# Patient Record
Sex: Male | Born: 1955 | Race: White | Hispanic: No | Marital: Married | Smoking: Never smoker
Health system: Southern US, Community
[De-identification: ages and names within clinical notes are randomized; demographics above are authoritative.]

## PROBLEM LIST (undated history)

## (undated) DIAGNOSIS — M2550 Pain in unspecified joint: Secondary | ICD-10-CM

## (undated) DIAGNOSIS — I10 Essential (primary) hypertension: Secondary | ICD-10-CM

## (undated) DIAGNOSIS — R7301 Impaired fasting glucose: Secondary | ICD-10-CM

## (undated) DIAGNOSIS — C61 Malignant neoplasm of prostate: Secondary | ICD-10-CM

## (undated) DIAGNOSIS — I2510 Atherosclerotic heart disease of native coronary artery without angina pectoris: Secondary | ICD-10-CM

## (undated) DIAGNOSIS — H269 Unspecified cataract: Secondary | ICD-10-CM

## (undated) DIAGNOSIS — E785 Hyperlipidemia, unspecified: Secondary | ICD-10-CM

## (undated) DIAGNOSIS — E559 Vitamin D deficiency, unspecified: Secondary | ICD-10-CM

## (undated) DIAGNOSIS — C801 Malignant (primary) neoplasm, unspecified: Secondary | ICD-10-CM

## (undated) DIAGNOSIS — E669 Obesity, unspecified: Secondary | ICD-10-CM

## (undated) DIAGNOSIS — R7989 Other specified abnormal findings of blood chemistry: Secondary | ICD-10-CM

## (undated) DIAGNOSIS — M5126 Other intervertebral disc displacement, lumbar region: Secondary | ICD-10-CM

## (undated) DIAGNOSIS — M1990 Unspecified osteoarthritis, unspecified site: Secondary | ICD-10-CM

## (undated) HISTORY — DX: Impaired fasting glucose: R73.01

## (undated) HISTORY — DX: Other specified abnormal findings of blood chemistry: R79.89

## (undated) HISTORY — DX: Malignant neoplasm of prostate: C61

## (undated) HISTORY — DX: Vitamin D deficiency, unspecified: E55.9

## (undated) HISTORY — DX: Pain in unspecified joint: M25.50

## (undated) HISTORY — PX: KNEE ARTHROPLASTY: SHX992

## (undated) HISTORY — DX: Hyperlipidemia, unspecified: E78.5

## (undated) HISTORY — DX: Other intervertebral disc displacement, lumbar region: M51.26

## (undated) HISTORY — DX: Unspecified cataract: H26.9

## (undated) HISTORY — PX: KNEE ARTHROSCOPY: SUR90

## (undated) HISTORY — DX: Obesity, unspecified: E66.9

## (undated) HISTORY — DX: Essential (primary) hypertension: I10

## (undated) HISTORY — PX: APPENDECTOMY: SHX54

## (undated) HISTORY — PX: PROSTATE BIOPSY: SHX241

## (undated) HISTORY — PX: EYE SURGERY: SHX253

---

## 2003-07-18 ENCOUNTER — Emergency Department (HOSPITAL_COMMUNITY): Admission: AD | Admit: 2003-07-18 | Discharge: 2003-07-18 | Payer: Self-pay | Admitting: Family Medicine

## 2004-07-07 ENCOUNTER — Ambulatory Visit: Payer: Self-pay | Admitting: Internal Medicine

## 2004-07-13 ENCOUNTER — Ambulatory Visit (HOSPITAL_COMMUNITY): Admission: RE | Admit: 2004-07-13 | Discharge: 2004-07-13 | Payer: Self-pay | Admitting: Orthopaedic Surgery

## 2004-07-14 ENCOUNTER — Ambulatory Visit: Payer: Self-pay | Admitting: Internal Medicine

## 2004-12-08 ENCOUNTER — Ambulatory Visit: Payer: Self-pay | Admitting: Internal Medicine

## 2004-12-13 ENCOUNTER — Ambulatory Visit: Payer: Self-pay | Admitting: Internal Medicine

## 2005-02-28 ENCOUNTER — Ambulatory Visit: Payer: Self-pay | Admitting: Internal Medicine

## 2005-03-07 ENCOUNTER — Ambulatory Visit: Payer: Self-pay | Admitting: Internal Medicine

## 2005-04-07 ENCOUNTER — Ambulatory Visit: Payer: Self-pay | Admitting: Internal Medicine

## 2005-10-15 ENCOUNTER — Emergency Department (HOSPITAL_COMMUNITY): Admission: EM | Admit: 2005-10-15 | Discharge: 2005-10-15 | Payer: Self-pay

## 2006-12-11 ENCOUNTER — Ambulatory Visit: Payer: Self-pay | Admitting: Internal Medicine

## 2006-12-11 LAB — CONVERTED CEMR LAB
ALT: 24 U/L
AST: 27 U/L
Albumin: 4.3 g/dL
Alkaline Phosphatase: 83 U/L
BUN: 14 mg/dL
Basophils Absolute: 0 K/uL
Basophils Relative: 0.3 %
Bilirubin, Direct: 0.1 mg/dL
CO2: 29 meq/L
Calcium: 9.4 mg/dL
Chloride: 106 meq/L
Cholesterol: 149 mg/dL
Creatinine, Ser: 0.9 mg/dL
Eosinophils Absolute: 0.3 K/uL
Eosinophils Relative: 4.9 %
GFR calc Af Amer: 115 mL/min
GFR calc non Af Amer: 95 mL/min
Glucose, Bld: 99 mg/dL
HCT: 44.6 %
HDL: 35.4 mg/dL — ABNORMAL LOW
Hemoglobin: 15.2 g/dL
LDL Cholesterol: 100 mg/dL — ABNORMAL HIGH
Lymphocytes Relative: 34 %
MCHC: 34 g/dL
MCV: 84.5 fL
Monocytes Absolute: 0.5 K/uL
Monocytes Relative: 8.5 %
Neutro Abs: 3.3 K/uL
Neutrophils Relative %: 52.3 %
PSA: 1.92 ng/mL
Platelets: 261 K/uL
Potassium: 4.2 meq/L
RBC: 5.28 M/uL
RDW: 12.4 %
Sodium: 141 meq/L
TSH: 1.47 u[IU]/mL
Total Bilirubin: 1 mg/dL
Total CHOL/HDL Ratio: 4.2
Total Protein: 6.9 g/dL
Triglycerides: 67 mg/dL
VLDL: 13 mg/dL
WBC: 6.2 10*3/microliter

## 2006-12-14 DIAGNOSIS — I10 Essential (primary) hypertension: Secondary | ICD-10-CM | POA: Insufficient documentation

## 2006-12-14 DIAGNOSIS — E785 Hyperlipidemia, unspecified: Secondary | ICD-10-CM | POA: Insufficient documentation

## 2006-12-18 ENCOUNTER — Ambulatory Visit: Payer: Self-pay | Admitting: Internal Medicine

## 2007-05-28 ENCOUNTER — Telehealth: Payer: Self-pay | Admitting: Internal Medicine

## 2007-05-30 ENCOUNTER — Telehealth (INDEPENDENT_AMBULATORY_CARE_PROVIDER_SITE_OTHER): Payer: Self-pay | Admitting: *Deleted

## 2008-01-02 ENCOUNTER — Telehealth (INDEPENDENT_AMBULATORY_CARE_PROVIDER_SITE_OTHER): Payer: Self-pay | Admitting: *Deleted

## 2010-06-27 ENCOUNTER — Telehealth: Payer: Self-pay | Admitting: Internal Medicine

## 2010-07-06 ENCOUNTER — Ambulatory Visit: Payer: Self-pay | Admitting: Psychology

## 2010-07-14 ENCOUNTER — Ambulatory Visit: Payer: Self-pay | Admitting: Psychology

## 2010-07-19 ENCOUNTER — Ambulatory Visit: Payer: Self-pay | Admitting: Psychology

## 2010-08-15 ENCOUNTER — Ambulatory Visit
Admission: RE | Admit: 2010-08-15 | Discharge: 2010-08-15 | Payer: Self-pay | Source: Home / Self Care | Attending: Psychology | Admitting: Psychology

## 2010-08-30 NOTE — Progress Notes (Signed)
  Phone Note Call from Patient   Caller: Patient Call For: Dr. Lovell Sheehan Summary of Call: Pt called for [facility].....Marland KitchenMarland KitchenLeft message to call back for Triage. Initial call taken by: Lynann Beaver CMA AAMA,  June 27, 2010 11:44 AM  Follow-up for Phone Call        Pt wants to see a marriage counselor and Pyschology or Pyschiatry.  Having marital problems. [phone] Follow-up by: Lynann Beaver CMA AAMA,  June 27, 2010 12:33 PM

## 2010-10-03 ENCOUNTER — Other Ambulatory Visit: Payer: Self-pay | Admitting: Internal Medicine

## 2010-10-10 ENCOUNTER — Other Ambulatory Visit: Payer: Self-pay

## 2010-10-17 ENCOUNTER — Encounter: Payer: Self-pay | Admitting: Internal Medicine

## 2010-11-02 ENCOUNTER — Encounter: Payer: Self-pay | Admitting: Internal Medicine

## 2010-11-08 ENCOUNTER — Other Ambulatory Visit (INDEPENDENT_AMBULATORY_CARE_PROVIDER_SITE_OTHER): Payer: PRIVATE HEALTH INSURANCE

## 2010-11-08 DIAGNOSIS — Z0000 Encounter for general adult medical examination without abnormal findings: Secondary | ICD-10-CM

## 2010-11-08 LAB — CBC WITH DIFFERENTIAL/PLATELET
Basophils Absolute: 0 10*3/uL (ref 0.0–0.1)
Basophils Relative: 0.4 % (ref 0.0–3.0)
Eosinophils Absolute: 0.3 10*3/uL (ref 0.0–0.7)
Eosinophils Relative: 5 % (ref 0.0–5.0)
HCT: 43.8 % (ref 39.0–52.0)
Lymphocytes Relative: 34.7 % (ref 12.0–46.0)
Lymphs Abs: 2 10*3/uL (ref 0.7–4.0)
MCHC: 34.9 g/dL (ref 30.0–36.0)
MCV: 85.9 fl (ref 78.0–100.0)
Monocytes Relative: 8.5 % (ref 3.0–12.0)
Neutro Abs: 3 10*3/uL (ref 1.4–7.7)
Neutrophils Relative %: 51.4 % (ref 43.0–77.0)
Platelets: 233 10*3/uL (ref 150.0–400.0)
RBC: 5.1 Mil/uL (ref 4.22–5.81)
RDW: 13.5 % (ref 11.5–14.6)

## 2010-11-08 LAB — BASIC METABOLIC PANEL
BUN: 18 mg/dL (ref 6–23)
CO2: 26 mEq/L (ref 19–32)
Calcium: 9.6 mg/dL (ref 8.4–10.5)
Chloride: 109 mEq/L (ref 96–112)
Creatinine, Ser: 0.9 mg/dL (ref 0.4–1.5)
GFR: 97.59 mL/min (ref >60.00)
Glucose, Bld: 101 mg/dL — ABNORMAL HIGH (ref 70–99)
Potassium: 4.6 mEq/L (ref 3.5–5.1)
Sodium: 143 mEq/L (ref 135–145)

## 2010-11-08 LAB — POCT URINALYSIS DIPSTICK
Protein, UA: NEGATIVE
Spec Grav, UA: 1.025
Urobilinogen, UA: 0.2
pH, UA: 5

## 2010-11-08 LAB — LIPID PANEL
Cholesterol: 142 mg/dL (ref 0–200)
HDL: 50.1 mg/dL (ref >39.00)
LDL Cholesterol: 78 mg/dL (ref 0–99)
Triglycerides: 70 mg/dL (ref 0.0–149.0)
VLDL: 14 mg/dL (ref 0.0–40.0)

## 2010-11-08 LAB — PSA: PSA: 2.96 ng/mL (ref 0.10–4.00)

## 2010-11-08 LAB — HEPATIC FUNCTION PANEL
ALT: 28 U/L (ref 0–53)
AST: 22 U/L (ref 0–37)
Albumin: 4.2 g/dL (ref 3.5–5.2)
Alkaline Phosphatase: 84 U/L (ref 39–117)
Bilirubin, Direct: 0.1 mg/dL (ref 0.0–0.3)
Total Bilirubin: 0.8 mg/dL (ref 0.3–1.2)
Total Protein: 6.6 g/dL (ref 6.0–8.3)

## 2010-11-08 LAB — TSH: TSH: 1.05 u[IU]/mL (ref 0.35–5.50)

## 2010-11-14 ENCOUNTER — Encounter: Payer: Self-pay | Admitting: Internal Medicine

## 2010-11-14 ENCOUNTER — Ambulatory Visit (INDEPENDENT_AMBULATORY_CARE_PROVIDER_SITE_OTHER): Payer: PRIVATE HEALTH INSURANCE | Admitting: Internal Medicine

## 2010-11-14 VITALS — BP 124/80 | HR 76 | Temp 98.3°F | Resp 14 | Ht 74.0 in | Wt 245.0 lb

## 2010-11-14 DIAGNOSIS — E785 Hyperlipidemia, unspecified: Secondary | ICD-10-CM

## 2010-11-14 DIAGNOSIS — Z0000 Encounter for general adult medical examination without abnormal findings: Secondary | ICD-10-CM

## 2010-11-14 DIAGNOSIS — E291 Testicular hypofunction: Secondary | ICD-10-CM

## 2010-11-14 DIAGNOSIS — I10 Essential (primary) hypertension: Secondary | ICD-10-CM

## 2010-11-14 LAB — TESTOSTERONE: Testosterone: 318.3 ng/dL — ABNORMAL LOW (ref 350.00–890.00)

## 2010-11-14 NOTE — Assessment & Plan Note (Signed)
Blood pressure stable on current medications.

## 2010-11-14 NOTE — Progress Notes (Signed)
  Subjective:    Patient ID: Austin Moses, male    DOB: [birth year], 55 y.o.   MRN: [id number]  HPI As a 55 year old white male presents for complete physical examination.  His risk factors include a family history of heart disease. His blood pressure is well controlled his cholesterol is controlled on Lipitor 40 he is being compliant with his medications he recently has lost weight and reaffirmed and exercise program which resulted in Laboratory values today.  Has been 3 years since his last physical his PSA is elevated there is no family history of prostate cancer the PSA may be elevated consistent with BPH   Review of Systems  Constitutional: Negative for fever and fatigue.  HENT: Negative for hearing loss, congestion, neck pain and postnasal drip.   Eyes: Negative for discharge, redness and visual disturbance.  Respiratory: Negative for cough, shortness of breath and wheezing.   Cardiovascular: Negative for leg swelling.  Gastrointestinal: Negative for abdominal pain, constipation and abdominal distention.  Genitourinary: Negative for urgency and frequency.  Musculoskeletal: Negative for joint swelling and arthralgias.  Skin: Negative for color change and rash.  Neurological: Negative for weakness and light-headedness.  Hematological: Negative for adenopathy.  Psychiatric/Behavioral: Negative for behavioral problems.       Past Medical History  Diagnosis Date  . Hyperlipidemia   . Hypertension    Past Surgical History  Procedure Date  . Knee arthroscopy     dr supple    reports that he has never smoked. He does not have any smokeless tobacco history on file. He reports that he does not drink alcohol or use illicit drugs. family history includes Dementia in his mother; Heart disease in his father; Hyperlipidemia in his mother; and Hypertension in his mother. No Known Allergies  Objective:   Physical Exam  Constitutional: He is oriented to person, place, and time. He  appears well-developed and well-nourished.  HENT:  Head: Normocephalic and atraumatic.  Eyes: Conjunctivae are normal. Pupils are equal, round, and reactive to light.  Neck: Normal range of motion. Neck supple.  Cardiovascular: Normal rate and regular rhythm.   Pulmonary/Chest: Effort normal and breath sounds normal.  Abdominal: Soft. Bowel sounds are normal.  Genitourinary: Rectum normal and prostate normal.       Enlargement of the prostate with normal architecture  Musculoskeletal: Normal range of motion.  Neurological: He is alert and oriented to person, place, and time.  Skin: Skin is warm and dry.  Psychiatric: Judgment and thought content normal.          Assessment & Plan:   Patient presents for yearly preventative medicine examination.   all immunizations and health maintenance protocols were reviewed with the patient and they are up to date with these protocols.   screening laboratory values were reviewed with the patient including screening of hyperlipidemia PSA renal function and hepatic function.   There medications past medical history social history problem list and allergies were reviewed in detail.   Goals were established with regard to weight loss exercise diet in compliance with medications The patient has noticed some decreased sexual functioning and fatigue a testosterone level will be obtained today and appropriate replacement to the level be below 350 this was explained to the patient about testosterone supplementation the risks and benefits and he understands. Recent cholesterol is currently well controlled on Lipitor 40 we have recommended that he continue this medication we'll give him a co-pay card.

## 2010-11-14 NOTE — Patient Instructions (Signed)
We'll check a testosterone level today and call you in about a week if your testosterone level is low I would recommend that you use a topical testosterone replacement there is one that you apply underneath your arm that we have had a great deal of success with... I recommend that you obtain saw palmetto for the mild prostate enlargement

## 2010-11-14 NOTE — Assessment & Plan Note (Signed)
Patient is experiencing some sexual dysfunction and muscle loss and increased fatigue we'll check a testosterone level level today and replace as necessary

## 2010-11-14 NOTE — Assessment & Plan Note (Signed)
Currently at goal on Lipitor 40 mg daily with additional supplementation

## 2010-11-18 ENCOUNTER — Other Ambulatory Visit: Payer: Self-pay | Admitting: *Deleted

## 2010-11-18 DIAGNOSIS — E291 Testicular hypofunction: Secondary | ICD-10-CM

## 2010-11-18 MED ORDER — TESTOSTERONE 30 MG/ACT TD SOLN: 30.0000 mg | Freq: Every day | TRANSDERMAL | Status: DC

## 2010-12-05 ENCOUNTER — Ambulatory Visit (AMBULATORY_SURGERY_CENTER): Payer: PRIVATE HEALTH INSURANCE | Admitting: *Deleted

## 2010-12-05 VITALS — Ht 74.0 in | Wt 247.4 lb

## 2010-12-05 DIAGNOSIS — Z1211 Encounter for screening for malignant neoplasm of colon: Secondary | ICD-10-CM

## 2010-12-05 MED ORDER — PEG-KCL-NACL-NASULF-NA ASC-C 100 G PO SOLR: ORAL | Status: DC

## 2010-12-07 ENCOUNTER — Encounter: Payer: Self-pay | Admitting: Internal Medicine

## 2010-12-09 ENCOUNTER — Other Ambulatory Visit: Payer: Self-pay | Admitting: Internal Medicine

## 2010-12-14 ENCOUNTER — Encounter: Payer: Self-pay | Admitting: Internal Medicine

## 2010-12-14 ENCOUNTER — Ambulatory Visit (AMBULATORY_SURGERY_CENTER): Payer: PRIVATE HEALTH INSURANCE | Admitting: Internal Medicine

## 2010-12-14 VITALS — BP 118/71 | HR 62 | Temp 96.9°F | Resp 22 | Ht 74.0 in | Wt 240.0 lb

## 2010-12-14 DIAGNOSIS — Z1211 Encounter for screening for malignant neoplasm of colon: Secondary | ICD-10-CM

## 2010-12-14 HISTORY — PX: COLONOSCOPY: SHX174

## 2010-12-14 MED ORDER — SODIUM CHLORIDE 0.9 % IV SOLN: 500.0000 mL | INTRAVENOUS | Status: DC

## 2010-12-14 NOTE — Progress Notes (Signed)
Pt talking off and on while the scope was coming out of the colon.  I asked if he was comfortable, he replied, "yes, I'm fine."  Pt rested comfortably all the way out.  No complaints.MAW

## 2010-12-14 NOTE — Patient Instructions (Addendum)
DISCHARGE INSTRUCTIONS BLUE & GREEN SHEETS INCLUDING SAFETY PRECAUTIONS DUE TO SEDATING MEDICATIONS GIVEN TODAY & RECOMMENDED DIET FOR TODAY DISCUSSED & GIVEN TO PT.& CAREPARTNER RECALL COLONOSCOPY DUE IN 10 YEARS.

## 2010-12-15 ENCOUNTER — Telehealth: Payer: Self-pay | Admitting: *Deleted

## 2010-12-15 NOTE — Telephone Encounter (Signed)
Follow up Call- Patient questions:  Do you have a fever, pain , or abdominal swelling? no Pain Score  0 *  Have you tolerated food without any problems? no  Have you been able to return to your normal activities? yes  Do you have any questions about your discharge instructions: Diet   no Medications  no Follow up visit  no  Do you have questions or concerns about your Care? no  Actions: * If pain score is 4 or above: No action needed, pain <4. Discard scores above unable to close encounter with out scores. Message left for patient to call with any questions or concerns

## 2010-12-15 NOTE — Telephone Encounter (Signed)
No answer, message left for the patient to call with any questions or concerns.

## 2011-01-16 ENCOUNTER — Telehealth: Payer: Self-pay | Admitting: Internal Medicine

## 2011-01-16 MED ORDER — ATORVASTATIN CALCIUM 40 MG PO TABS: 40.0000 mg | ORAL_TABLET | Freq: Every day | ORAL | Status: DC

## 2011-01-16 NOTE — Telephone Encounter (Signed)
sent 

## 2011-01-16 NOTE — Telephone Encounter (Signed)
Refill Lipitor on [facility]----[address].

## 2011-02-05 ENCOUNTER — Other Ambulatory Visit: Payer: Self-pay | Admitting: Internal Medicine

## 2011-04-13 ENCOUNTER — Other Ambulatory Visit: Payer: Self-pay | Admitting: Internal Medicine

## 2011-06-06 ENCOUNTER — Other Ambulatory Visit: Payer: Self-pay | Admitting: Internal Medicine

## 2011-06-20 ENCOUNTER — Other Ambulatory Visit: Payer: Self-pay | Admitting: Internal Medicine

## 2011-08-20 ENCOUNTER — Other Ambulatory Visit: Payer: Self-pay | Admitting: Internal Medicine

## 2011-08-25 ENCOUNTER — Telehealth: Payer: Self-pay | Admitting: *Deleted

## 2011-08-25 NOTE — Telephone Encounter (Signed)
Done

## 2012-01-18 ENCOUNTER — Other Ambulatory Visit: Payer: Self-pay | Admitting: *Deleted

## 2012-01-18 ENCOUNTER — Other Ambulatory Visit: Payer: Self-pay | Admitting: Internal Medicine

## 2012-01-19 ENCOUNTER — Other Ambulatory Visit: Payer: Self-pay | Admitting: Internal Medicine

## 2012-06-12 ENCOUNTER — Other Ambulatory Visit: Payer: PRIVATE HEALTH INSURANCE

## 2012-06-19 ENCOUNTER — Encounter: Payer: PRIVATE HEALTH INSURANCE | Admitting: Internal Medicine

## 2012-08-21 ENCOUNTER — Other Ambulatory Visit: Payer: PRIVATE HEALTH INSURANCE

## 2012-08-28 ENCOUNTER — Encounter: Payer: PRIVATE HEALTH INSURANCE | Admitting: Internal Medicine

## 2012-09-05 ENCOUNTER — Other Ambulatory Visit: Payer: Self-pay | Admitting: Internal Medicine

## 2012-09-05 ENCOUNTER — Other Ambulatory Visit: Payer: Self-pay | Admitting: *Deleted

## 2012-09-16 ENCOUNTER — Other Ambulatory Visit: Payer: Self-pay | Admitting: Internal Medicine

## 2012-09-16 ENCOUNTER — Other Ambulatory Visit (INDEPENDENT_AMBULATORY_CARE_PROVIDER_SITE_OTHER): Payer: PRIVATE HEALTH INSURANCE

## 2012-09-16 DIAGNOSIS — Z0000 Encounter for general adult medical examination without abnormal findings: Secondary | ICD-10-CM

## 2012-09-16 LAB — BASIC METABOLIC PANEL
BUN: 21 mg/dL (ref 6–23)
CO2: 26 mEq/L (ref 19–32)
Calcium: 9.5 mg/dL (ref 8.4–10.5)
Chloride: 104 mEq/L (ref 96–112)
Creatinine, Ser: 1 mg/dL (ref 0.4–1.5)
GFR: 84.47 mL/min (ref >60.00)
Glucose, Bld: 101 mg/dL — ABNORMAL HIGH (ref 70–99)
Potassium: 4.5 mEq/L (ref 3.5–5.1)
Sodium: 137 mEq/L (ref 135–145)

## 2012-09-16 LAB — CBC WITH DIFFERENTIAL/PLATELET
Basophils Absolute: 0 10*3/uL (ref 0.0–0.1)
Basophils Relative: 0.5 % (ref 0.0–3.0)
Eosinophils Absolute: 0.4 10*3/uL (ref 0.0–0.7)
Eosinophils Relative: 5.2 % — ABNORMAL HIGH (ref 0.0–5.0)
HCT: 46.5 % (ref 39.0–52.0)
Hemoglobin: 15.7 g/dL (ref 13.0–17.0)
Lymphocytes Relative: 33.5 % (ref 12.0–46.0)
Lymphs Abs: 2.3 10*3/uL (ref 0.7–4.0)
MCHC: 33.7 g/dL (ref 30.0–36.0)
MCV: 84.7 fl (ref 78.0–100.0)
Monocytes Absolute: 0.6 10*3/uL (ref 0.1–1.0)
Monocytes Relative: 8.9 % (ref 3.0–12.0)
Neutro Abs: 3.5 10*3/uL (ref 1.4–7.7)
Neutrophils Relative %: 51.9 % (ref 43.0–77.0)
Platelets: 227 10*3/uL (ref 150.0–400.0)
RBC: 5.49 Mil/uL (ref 4.22–5.81)
RDW: 13.1 % (ref 11.5–14.6)

## 2012-09-16 LAB — LIPID PANEL
Cholesterol: 186 mg/dL (ref 0–200)
HDL: 50.5 mg/dL (ref >39.00)
LDL Cholesterol: 103 mg/dL — ABNORMAL HIGH (ref 0–99)
Triglycerides: 165 mg/dL — ABNORMAL HIGH (ref 0.0–149.0)
VLDL: 33 mg/dL (ref 0.0–40.0)

## 2012-09-16 LAB — POCT URINALYSIS DIPSTICK
Bilirubin, UA: NEGATIVE
Blood, UA: NEGATIVE
Glucose, UA: NEGATIVE
Ketones, UA: NEGATIVE
Spec Grav, UA: 1.01
Urobilinogen, UA: 0.2

## 2012-09-16 LAB — HEPATIC FUNCTION PANEL
ALT: 39 U/L (ref 0–53)
AST: 25 U/L (ref 0–37)
Albumin: 4.4 g/dL (ref 3.5–5.2)
Alkaline Phosphatase: 68 U/L (ref 39–117)
Bilirubin, Direct: 0.2 mg/dL (ref 0.0–0.3)
Total Bilirubin: 1.1 mg/dL (ref 0.3–1.2)
Total Protein: 6.9 g/dL (ref 6.0–8.3)

## 2012-09-16 LAB — PSA: PSA: 2.74 ng/mL (ref 0.10–4.00)

## 2012-09-16 LAB — TSH: TSH: 1.78 u[IU]/mL (ref 0.35–5.50)

## 2012-09-23 ENCOUNTER — Encounter: Payer: Self-pay | Admitting: Internal Medicine

## 2012-09-23 ENCOUNTER — Ambulatory Visit (INDEPENDENT_AMBULATORY_CARE_PROVIDER_SITE_OTHER): Payer: PRIVATE HEALTH INSURANCE | Admitting: Internal Medicine

## 2012-09-23 VITALS — BP 140/80 | HR 64 | Temp 97.8°F | Resp 16 | Ht 74.0 in | Wt 250.0 lb

## 2012-09-23 DIAGNOSIS — E291 Testicular hypofunction: Secondary | ICD-10-CM

## 2012-09-23 DIAGNOSIS — I10 Essential (primary) hypertension: Secondary | ICD-10-CM

## 2012-09-23 DIAGNOSIS — Z0000 Encounter for general adult medical examination without abnormal findings: Secondary | ICD-10-CM

## 2012-09-23 NOTE — Progress Notes (Signed)
Subjective:    Patient ID: Austin Moses, male    DOB: [birth year], 57 y.o.   MRN: [id number]  HPI  Presents for yearly physical examination.  The patient has been on testosterone topical  As we stop the topical and has been taking a supplement from a health food store that is meant to stimulate testicular function  Review of Systems  Constitutional: Negative for fever and fatigue.  HENT: Negative for hearing loss, congestion, neck pain and postnasal drip.   Eyes: Negative for discharge, redness and visual disturbance.  Respiratory: Negative for cough, shortness of breath and wheezing.   Cardiovascular: Negative for leg swelling.  Gastrointestinal: Negative for abdominal pain, constipation and abdominal distention.  Genitourinary: Negative for urgency and frequency.  Musculoskeletal: Negative for joint swelling and arthralgias.  Skin: Negative for color change and rash.  Neurological: Negative for weakness and light-headedness.  Hematological: Negative for adenopathy.  Psychiatric/Behavioral: Negative for behavioral problems.   Past Medical History  Diagnosis Date  . Hyperlipidemia   . Hypertension     History   Social History  . Marital Status: Single    Spouse Name: N/A    Number of Children: N/A  . Years of Education: N/A   Occupational History  . Not on file.   Social History Main Topics  . Smoking status: Never Smoker   . Smokeless tobacco: Not on file  . Alcohol Use: 3.0 oz/week    5 Cans of beer per week  . Drug Use: No  . Sexually Active: Yes   Other Topics Concern  . Not on file   Social History Narrative  . No narrative on file    Past Surgical History  Procedure Laterality Date  . Knee arthroscopy      dr supple  . Appendectomy    . Colonoscopy  12/14/2010    Normal    Family History  Problem Relation Age of Onset  . Hyperlipidemia Mother   . Hypertension Mother   . Dementia Mother   . Heart disease Father   . Colon polyps Father      Allergies  Allergen Reactions  . Sulfa Antibiotics Rash    Current Outpatient Prescriptions on File Prior to Visit  Medication Sig Dispense Refill  . amLODipine-benazepril (LOTREL) 5-10 MG per capsule take 1 capsule by mouth once daily  30 capsule  PRN  . aspirin 81 MG EC tablet Take 81 mg by mouth daily.        Marland Kitchen atorvastatin (LIPITOR) 40 MG tablet take 1 tablet by mouth once daily  30 tablet  11  . fish oil-omega-3 fatty acids 1000 MG capsule Take 2 g by mouth daily.        Randa Ngo 30 MG/ACT SOLN apply 1 PUMP once daily  90 mL  5   Current Facility-Administered Medications on File Prior to Visit  Medication Dose Route Frequency Provider Last Rate Last Dose  . 0.9 %  sodium chloride infusion  500 mL Intravenous Continuous Iva Boop, MD        BP 140/80  Pulse 64  Temp(Src) 97.8 F (36.6 C)  Resp 16  Ht 6\' 2"  (1.88 m)  Wt 250 lb (113.399 kg)  BMI 32.08 kg/m2       Objective:   Physical Exam  Nursing note and vitals reviewed. Constitutional: He is oriented to person, place, and time. He appears well-developed and well-nourished.  HENT:  Head: Normocephalic and atraumatic.  Eyes: Conjunctivae are normal. Pupils  are equal, round, and reactive to light.  Neck: Normal range of motion. Neck supple.  Cardiovascular: Normal rate and regular rhythm.   Pulmonary/Chest: Effort normal and breath sounds normal.  Abdominal: Soft. Bowel sounds are normal.  Genitourinary: Rectum normal and prostate normal.  2 enlarged and smooth  Musculoskeletal: Normal range of motion.  Neurological: He is alert and oriented to person, place, and time.  Skin: Skin is warm and dry.  Psychiatric: He has a normal mood and affect.          Assessment & Plan:   Patient presents for yearly preventative medicine examination.   all immunizations and health maintenance protocols were reviewed with the patient and they are up to date with these protocols.   screening laboratory values  were reviewed with the patient including screening of hyperlipidemia PSA renal function and hepatic function.   There medications past medical history social history problem list and allergies were reviewed in detail.   Goals were established with regard to weight loss exercise diet in compliance with medications

## 2012-10-11 ENCOUNTER — Encounter: Payer: Self-pay | Admitting: Internal Medicine

## 2012-10-30 ENCOUNTER — Telehealth: Payer: Self-pay | Admitting: Internal Medicine

## 2012-10-30 MED ORDER — TESTOSTERONE CYPIONATE 200 MG/ML IM SOLN: 50.0000 mg | INTRAMUSCULAR | Status: DC

## 2012-10-30 NOTE — Telephone Encounter (Signed)
Spoke w/ Mosetta Putt re testosterone options - please see Pt Email in chart. He does want to give the injections a try - Dr. Lovell Sheehan' pt email message suggested once weekly injections. Pt states he can give himself the injection. Please advise re switching from Axiron to injections.  Pt uses [facility] [address].

## 2013-01-13 ENCOUNTER — Other Ambulatory Visit: Payer: Self-pay | Admitting: Internal Medicine

## 2013-02-07 ENCOUNTER — Other Ambulatory Visit: Payer: Self-pay | Admitting: Internal Medicine

## 2013-04-03 ENCOUNTER — Other Ambulatory Visit: Payer: Self-pay | Admitting: *Deleted

## 2013-04-03 MED ORDER — TESTOSTERONE CYPIONATE 200 MG/ML IM SOLN: 50.0000 mg | INTRAMUSCULAR | Status: DC

## 2013-06-05 ENCOUNTER — Other Ambulatory Visit: Payer: Self-pay

## 2013-10-06 ENCOUNTER — Other Ambulatory Visit: Payer: Self-pay | Admitting: Internal Medicine

## 2014-01-15 ENCOUNTER — Telehealth: Payer: Self-pay | Admitting: Internal Medicine

## 2014-01-15 NOTE — Telephone Encounter (Signed)
°   [place] [place] [facility] [place] is requesting re-fills on the following: amLODipine-benazepril (LOTREL) 5-10 MG per capsule testosterone cypionate (DEPO-TESTOSTERONE) 200 MG/ML injection atorvastatin (LIPITOR) 40 MG tablet

## 2014-01-16 NOTE — Telephone Encounter (Signed)
Pt needs an OV  

## 2014-01-16 NOTE — Telephone Encounter (Signed)
LMOM for pt to call office and schedule OV. °

## 2014-01-16 NOTE — Telephone Encounter (Signed)
Pt called back stating he has moved out of the city and would like to know if he can have his medication re-filled until he is established with another PCP in his area??

## 2014-01-19 MED ORDER — TESTOSTERONE CYPIONATE 200 MG/ML IM SOLN: 50.0000 mg | INTRAMUSCULAR | Status: DC

## 2014-01-19 MED ORDER — ATORVASTATIN CALCIUM 40 MG PO TABS: ORAL_TABLET | ORAL | Status: DC

## 2014-01-19 MED ORDER — AMLODIPINE BESY-BENAZEPRIL HCL 5-10 MG PO CAPS: ORAL_CAPSULE | ORAL | Status: DC

## 2014-01-19 NOTE — Telephone Encounter (Signed)
rx's sent in electronically to [facility]

## 2014-01-22 ENCOUNTER — Other Ambulatory Visit: Payer: Self-pay | Admitting: Internal Medicine

## 2014-04-10 ENCOUNTER — Other Ambulatory Visit: Payer: Self-pay | Admitting: Internal Medicine

## 2015-11-16 DIAGNOSIS — M1712 Unilateral primary osteoarthritis, left knee: Secondary | ICD-10-CM | POA: Diagnosis not present

## 2015-11-16 DIAGNOSIS — M25562 Pain in left knee: Secondary | ICD-10-CM | POA: Diagnosis not present

## 2015-12-06 DIAGNOSIS — Z1389 Encounter for screening for other disorder: Secondary | ICD-10-CM | POA: Diagnosis not present

## 2015-12-06 DIAGNOSIS — I10 Essential (primary) hypertension: Secondary | ICD-10-CM | POA: Diagnosis not present

## 2015-12-06 DIAGNOSIS — E784 Other hyperlipidemia: Secondary | ICD-10-CM | POA: Diagnosis not present

## 2015-12-06 DIAGNOSIS — R972 Elevated prostate specific antigen [PSA]: Secondary | ICD-10-CM | POA: Diagnosis not present

## 2015-12-06 DIAGNOSIS — R635 Abnormal weight gain: Secondary | ICD-10-CM | POA: Diagnosis not present

## 2016-01-03 DIAGNOSIS — R972 Elevated prostate specific antigen [PSA]: Secondary | ICD-10-CM | POA: Diagnosis not present

## 2016-02-11 DIAGNOSIS — R972 Elevated prostate specific antigen [PSA]: Secondary | ICD-10-CM | POA: Diagnosis not present

## 2016-03-22 DIAGNOSIS — R972 Elevated prostate specific antigen [PSA]: Secondary | ICD-10-CM | POA: Diagnosis not present

## 2016-04-05 DIAGNOSIS — C61 Malignant neoplasm of prostate: Secondary | ICD-10-CM | POA: Diagnosis not present

## 2016-04-05 DIAGNOSIS — E291 Testicular hypofunction: Secondary | ICD-10-CM | POA: Diagnosis not present

## 2016-04-05 DIAGNOSIS — N5201 Erectile dysfunction due to arterial insufficiency: Secondary | ICD-10-CM | POA: Diagnosis not present

## 2016-05-16 DIAGNOSIS — C61 Malignant neoplasm of prostate: Secondary | ICD-10-CM | POA: Diagnosis not present

## 2016-05-30 DIAGNOSIS — M25562 Pain in left knee: Secondary | ICD-10-CM | POA: Diagnosis not present

## 2016-05-30 DIAGNOSIS — M1712 Unilateral primary osteoarthritis, left knee: Secondary | ICD-10-CM | POA: Diagnosis not present

## 2016-05-31 HISTORY — PX: OTHER SURGICAL HISTORY: SHX169

## 2016-07-03 DIAGNOSIS — Z0000 Encounter for general adult medical examination without abnormal findings: Secondary | ICD-10-CM | POA: Diagnosis not present

## 2016-07-06 DIAGNOSIS — Z0000 Encounter for general adult medical examination without abnormal findings: Secondary | ICD-10-CM | POA: Diagnosis not present

## 2016-07-10 DIAGNOSIS — Z1389 Encounter for screening for other disorder: Secondary | ICD-10-CM | POA: Diagnosis not present

## 2016-07-10 DIAGNOSIS — E784 Other hyperlipidemia: Secondary | ICD-10-CM | POA: Diagnosis not present

## 2016-07-10 DIAGNOSIS — C61 Malignant neoplasm of prostate: Secondary | ICD-10-CM | POA: Diagnosis not present

## 2016-07-10 DIAGNOSIS — I10 Essential (primary) hypertension: Secondary | ICD-10-CM | POA: Diagnosis not present

## 2016-07-10 DIAGNOSIS — Z0000 Encounter for general adult medical examination without abnormal findings: Secondary | ICD-10-CM | POA: Diagnosis not present

## 2016-07-10 DIAGNOSIS — R7301 Impaired fasting glucose: Secondary | ICD-10-CM | POA: Diagnosis not present

## 2016-07-12 ENCOUNTER — Other Ambulatory Visit: Payer: Self-pay | Admitting: Urology

## 2016-07-12 NOTE — Progress Notes (Signed)
Pt is being scheduled for preop appt; please place surgical orders in epic. Thanks.  

## 2016-07-18 DIAGNOSIS — Z1212 Encounter for screening for malignant neoplasm of rectum: Secondary | ICD-10-CM | POA: Diagnosis not present

## 2016-07-20 NOTE — Patient Instructions (Addendum)
Austin Moses  07/20/2016   Your procedure is scheduled on: Monday 08/07/2016  Report to [facility] Main  Entrance take [place]  elevators to 3rd floor to  [place] at  [phone].  Call this number if you have problems the morning of surgery [phone]   Remember: ONLY 1 PERSON MAY GO WITH YOU TO SHORT STAY TO GET  READY MORNING OF [place].               FOLLOW BOWEL PREP INSTRUCTIONS FROM DR. BORDEN'S OFFICE THE DAY BEFORE SURGERY ALONG WITH A CLEAR DIET ALL DAY TIL MIDNIGHT!              DRINK ONE BOTTLE OF MAGNESIUM CITRATE BY 12 NOON THE DAY BEFORE SURGERY AND FOLLOW A CLEAR LIQUID DIET ALL DAY!              USE ONE CONTAINER OF FLEET'S ENEMA NIGHT BEFORE SURGERY!         CLEAR LIQUID DIET   Foods Allowed                                                                     Foods Excluded  Coffee and tea, regular and decaf                             liquids that you cannot  Plain Jell-O in any flavor                                             see through such as: Fruit ices (not with fruit pulp)                                     milk, soups, orange juice  Iced Popsicles                                    All solid food Carbonated beverages, regular and diet                                    Cranberry, grape and apple juices Sports drinks like Gatorade Lightly seasoned clear broth or consume(fat free) Sugar, honey syrup  Sample Menu Breakfast                                Lunch                                     Supper Cranberry juice                    Beef broth  Chicken broth Jell-O                                     Grape juice                           Apple juice Coffee or tea                        Jell-O                                      Popsicle                                                Coffee or tea                        Coffee or  tea  _____________________________________________________________________    Do not eat food or drink liquids :After Midnight.     Take these medicines the morning of surgery with A SIP OF WATER: none                                  You may not have any metal on your body including hair pins and              piercings  Do not wear jewelry, make-up, lotions, powders or perfumes, deodorant             Do not wear nail polish.  Do not shave  48 hours prior to surgery.              Men may shave face and neck.   Do not bring valuables to the hospital. [place].  Contacts, dentures or bridgework may not be worn into surgery.  Leave suitcase in the car. After surgery it may be brought to your room.                  Please read over the following fact sheets you were given: _____________________________________________________________________             [facility] - Preparing for Surgery Before surgery, you can play an important role.  Because skin is not sterile, your skin needs to be as free of germs as possible.  You can reduce the number of germs on your skin by washing with CHG (chlorahexidine gluconate) soap before surgery.  CHG is an antiseptic cleaner which kills germs and bonds with the skin to continue killing germs even after washing. Please DO NOT use if you have an allergy to CHG or antibacterial soaps.  If your skin becomes reddened/irritated stop using the CHG and inform your nurse when you arrive at Short Stay. Do not shave (including legs and underarms) for at least 48 hours prior to the first CHG shower.  You may shave your face/neck. Please follow these instructions carefully:  1.  Shower with CHG Soap the night before  surgery and the  morning of Surgery.  2.  If you choose to wash your hair, wash your hair first as usual with your  normal  shampoo.  3.  After you shampoo, rinse your hair and body thoroughly to  remove the  shampoo.                           4.  Use CHG as you would any other liquid soap.  You can apply chg directly  to the skin and wash                       Gently with a scrungie or clean washcloth.  5.  Apply the CHG Soap to your body ONLY FROM THE NECK DOWN.   Do not use on face/ open                           Wound or open sores. Avoid contact with eyes, ears mouth and genitals (private parts).                       Wash face,  Genitals (private parts) with your normal soap.             6.  Wash thoroughly, paying special attention to the area where your surgery  will be performed.  7.  Thoroughly rinse your body with warm water from the neck down.  8.  DO NOT shower/wash with your normal soap after using and rinsing off  the CHG Soap.                9.  Pat yourself dry with a clean towel.            10.  Wear clean pajamas.            11.  Place clean sheets on your bed the night of your first shower and do not  sleep with pets. Day of Surgery : Do not apply any lotions/deodorants the morning of surgery.  Please wear clean clothes to the hospital/surgery center.  FAILURE TO FOLLOW THESE INSTRUCTIONS MAY RESULT IN THE CANCELLATION OF YOUR SURGERY PATIENT SIGNATURE_________________________________  NURSE SIGNATURE__________________________________  ________________________________________________________________________  WHAT IS A BLOOD TRANSFUSION? Blood Transfusion Information  A transfusion is the replacement of blood or some of its parts. Blood is made up of multiple cells which provide different functions.  Red blood cells carry oxygen and are used for blood loss replacement.  White blood cells fight against infection.  Platelets control bleeding.  Plasma helps clot blood.  Other blood products are available for specialized needs, such as hemophilia or other clotting disorders. BEFORE THE TRANSFUSION  Who gives blood for transfusions?   Healthy volunteers who  are fully evaluated to make sure their blood is safe. This is blood bank blood. Transfusion therapy is the safest it has ever been in the practice of medicine. Before blood is taken from a donor, a complete history is taken to make sure that person has no history of diseases nor engages in risky social behavior (examples are intravenous drug use or sexual activity with multiple partners). The donor's travel history is screened to minimize risk of transmitting infections, such as malaria. The donated blood is tested for signs of infectious diseases, such as HIV and hepatitis. The blood is then tested to be sure it is compatible with  you in order to minimize the chance of a transfusion reaction. If you or a relative donates blood, this is often done in anticipation of surgery and is not appropriate for emergency situations. It takes many days to process the donated blood. RISKS AND COMPLICATIONS Although transfusion therapy is very safe and saves many lives, the main dangers of transfusion include:   Getting an infectious disease.  Developing a transfusion reaction. This is an allergic reaction to something in the blood you were given. Every precaution is taken to prevent this. The decision to have a blood transfusion has been considered carefully by your caregiver before blood is given. Blood is not given unless the benefits outweigh the risks. AFTER THE TRANSFUSION  Right after receiving a blood transfusion, you will usually feel much better and more energetic. This is especially true if your red blood cells have gotten low (anemic). The transfusion raises the level of the red blood cells which carry oxygen, and this usually causes an energy increase.  The nurse administering the transfusion will monitor you carefully for complications. HOME CARE INSTRUCTIONS  No special instructions are needed after a transfusion. You may find your energy is better. Speak with your caregiver about any limitations on  activity for underlying diseases you may have. SEEK MEDICAL CARE IF:   Your condition is not improving after your transfusion.  You develop redness or irritation at the intravenous (IV) site. SEEK IMMEDIATE MEDICAL CARE IF:  Any of the following symptoms occur over the next 12 hours:  Shaking chills.  You have a temperature by mouth above 102 F (38.9 C), not controlled by medicine.  Chest, back, or muscle pain.  People around you feel you are not acting correctly or are confused.  Shortness of breath or difficulty breathing.  Dizziness and fainting.  You get a rash or develop hives.  You have a decrease in urine output.  Your urine turns a dark color or changes to pink, red, or brown. Any of the following symptoms occur over the next 10 days:  You have a temperature by mouth above 102 F (38.9 C), not controlled by medicine.  Shortness of breath.  Weakness after normal activity.  The white part of the eye turns yellow (jaundice).  You have a decrease in the amount of urine or are urinating less often.  Your urine turns a dark color or changes to pink, red, or brown. Document Released: 07/14/2000 Document Revised: 10/09/2011 Document Reviewed: 03/02/2008 [facility] Patient Information 2014 [place], [place].  _______________________________________________________________________

## 2016-07-25 ENCOUNTER — Encounter (HOSPITAL_COMMUNITY): Payer: Self-pay

## 2016-07-25 ENCOUNTER — Encounter (HOSPITAL_COMMUNITY)
Admission: RE | Admit: 2016-07-25 | Discharge: 2016-07-25 | Disposition: A | Discharge status: Home / Self Care | Payer: BLUE CROSS/BLUE SHIELD | Source: Ambulatory Visit | Attending: Urology | Admitting: Urology

## 2016-07-25 ENCOUNTER — Ambulatory Visit (HOSPITAL_COMMUNITY)
Admission: RE | Admit: 2016-07-25 | Discharge: 2016-07-25 | Disposition: A | Discharge status: Home / Self Care | Payer: BLUE CROSS/BLUE SHIELD | Source: Ambulatory Visit | Attending: Urology | Admitting: Urology

## 2016-07-25 DIAGNOSIS — Z01812 Encounter for preprocedural laboratory examination: Secondary | ICD-10-CM | POA: Insufficient documentation

## 2016-07-25 DIAGNOSIS — C61 Malignant neoplasm of prostate: Secondary | ICD-10-CM | POA: Diagnosis not present

## 2016-07-25 DIAGNOSIS — R9431 Abnormal electrocardiogram [ECG] [EKG]: Secondary | ICD-10-CM | POA: Insufficient documentation

## 2016-07-25 DIAGNOSIS — I10 Essential (primary) hypertension: Secondary | ICD-10-CM | POA: Diagnosis not present

## 2016-07-25 DIAGNOSIS — M6281 Muscle weakness (generalized): Secondary | ICD-10-CM | POA: Diagnosis not present

## 2016-07-25 DIAGNOSIS — Z01818 Encounter for other preprocedural examination: Secondary | ICD-10-CM | POA: Insufficient documentation

## 2016-07-25 DIAGNOSIS — Z01811 Encounter for preprocedural respiratory examination: Secondary | ICD-10-CM

## 2016-07-25 HISTORY — DX: Malignant (primary) neoplasm, unspecified: C80.1

## 2016-07-25 HISTORY — DX: Unspecified osteoarthritis, unspecified site: M19.90

## 2016-07-25 LAB — CBC
HCT: 43 % (ref 39.0–52.0)
Hemoglobin: 14.7 g/dL (ref 13.0–17.0)
MCH: 27.7 pg (ref 26.0–34.0)
MCHC: 34.2 g/dL (ref 30.0–36.0)
MCV: 81 fL (ref 78.0–100.0)
PLATELETS: 267 10*3/uL (ref 150–400)
RBC: 5.31 MIL/uL (ref 4.22–5.81)
RDW: 13 % (ref 11.5–15.5)
WBC: 9.5 10*3/uL (ref 4.0–10.5)

## 2016-07-25 LAB — BASIC METABOLIC PANEL
Anion gap: 6 (ref 5–15)
BUN: 16 mg/dL (ref 6–20)
CALCIUM: 9.4 mg/dL (ref 8.9–10.3)
CO2: 28 mmol/L (ref 22–32)
CREATININE: 1.15 mg/dL (ref 0.61–1.24)
Chloride: 109 mmol/L (ref 101–111)
GFR calc non Af Amer: >60 mL/min (ref >60)
Glucose, Bld: 96 mg/dL (ref 65–99)
Potassium: 4.1 mmol/L (ref 3.5–5.1)
SODIUM: 143 mmol/L (ref 135–145)

## 2016-07-25 NOTE — Progress Notes (Signed)
   07/25/16 1307  OBSTRUCTIVE SLEEP APNEA  Have you ever been diagnosed with sleep apnea through a sleep study? No  Do you snore loudly (loud enough to be heard through closed doors)?  0  Do you often feel tired, fatigued, or sleepy during the daytime (such as falling asleep during driving or talking to someone)? 0  Has anyone observed you stop breathing during your sleep? 0  Do you have, or are you being treated for high blood pressure? 1  BMI more than 35 kg/m2? 1  Age > 50 (1-yes) 1  Neck circumference greater than:Male 16 inches or larger, Male 17inches or larger? 1  Male Gender (Yes=1) 1  Obstructive Sleep Apnea Score 5  Score 5 or greater  Results sent to PCP

## 2016-08-04 NOTE — H&P (Signed)
CC/HPI: CC: Prostate Cancer    Austin Moses is a 61 year old gentleman who was noted to have an elevated PSA of 6.67 prompting a TRUS biopsy of the prostate on 03/22/16. This confirmed Gleason 3+3=6 adenocarcinoma of the prostate in 30% of 1 out of 12 biopsy cores.   Family history: None   Imaging studies: None   PMH: He has a history of hypertension, hypercholesterolemia, and testosterone deficiency.  PSH: Appendectomy.   TNM stage: cT1c Nx Mx  PSA: 6.67  Gleason score: 3+3=6  Biopsy (03/22/16): 1/12 cores positive  Left: L base (30%, 3+3=6)  Right: Benign  Prostate volume: 84 cc  PSAD: 0.08   Nomogram  OC disease: 66%  EPE: 33%  SVI: 1%  LNI: 1%  PFS (5 year, 10 year): 95%,90%   Urinary function: IPSS is 4.  Erectile function: SHIM score is 2. He does have severe erectile dysfunction. He has not previously tried any treatment as this has been a lower priority to him and his wife.     ALLERGIES: Sulfa Drugs    MEDICATIONS: Aspirin 81 MG TABS Oral  Lipitor 40 MG Oral Tablet Oral  Lotrel 5-10 MG Oral Capsule Oral     GU PSH: Prostate Needle Biopsy - 03/22/2016      PSH Notes: Knee Surgery, Cataract Surgery   NON-GU PSH: Surgical Pathology, Gross And Microscopic Examination For Prostate Needle - 03/22/2016    GU PMH: ED, arterial insufficiency, SHIM is 11. - 04/05/2016 Prostate Cancer, T1c Nx Mx Gleason 6 very low risk prostate cancer. 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   Social Drinker.  Drinks 3 caffeinated drinks per day.     Notes: Never a smoker, Occupation, Number of children, Alcohol Use, Marital History - Currently Married, Caffeine Use, Tobacco Use   REVIEW OF SYSTEMS:    GU Review Male:   Patient denies frequent urination, hard to postpone urination, burning/ pain with urination, get up at night to urinate, leakage of urine, stream starts and stops, trouble starting your streams, and have to strain to urinate .  Gastrointestinal (Upper):   Patient denies nausea and vomiting.  Gastrointestinal (Lower):   Patient denies diarrhea and constipation.  Constitutional:   Patient denies fever, night sweats, weight loss, and fatigue.  Skin:   Patient denies skin rash/ lesion and itching.  Eyes:   Patient denies blurred vision and double vision.  Ears/ Nose/ Throat:   Patient denies sore throat and sinus problems.  Hematologic/Lymphatic:   Patient denies swollen glands and easy bruising.  Cardiovascular:   Patient denies leg swelling and chest pains.  Respiratory:   Patient denies cough and shortness of breath.  Endocrine:   Patient denies excessive thirst.  Musculoskeletal:   Patient denies back pain and joint pain.  Neurological:   Patient denies headaches and dizziness.  Psychologic:   Patient denies depression and anxiety.      MULTI-SYSTEM PHYSICAL EXAMINATION:    Constitutional: Well-nourished. No physical deformities. Normally developed. Good grooming.  Neck: Neck symmetrical, not swollen. Normal tracheal  position.  Respiratory: No labored breathing, no use of accessory muscles. Clear bilaterally.  Cardiovascular: Normal temperature, normal extremity pulses, no swelling, no varicosities. Regular rate and rhythm.  Lymphatic: No enlargement of neck, axillae, groin.  Skin: No paleness, no jaundice, no cyanosis. No lesion, no ulcer, no rash.  Neurologic / Psychiatric: Oriented to time, oriented to place, oriented to person. No depression, no anxiety,  no agitation.  Gastrointestinal: No mass, no tenderness, no rigidity, non obese abdomen. He has a small reducible umbilical hernia.  Eyes: Normal conjunctivae. Normal eyelids.  Ears, Nose, Mouth, and Throat: Left ear no scars, no lesions, no masses. Right ear no scars, no lesions, no masses. Nose no scars, no lesions, no masses. Normal hearing. Normal lips.  Musculoskeletal: Normal gait and station of head and neck.          ASSESSMENT:      ICD-10 Details  1 GU:   Prostate Cancer - C61    PLAN:     1. Very low risk prostate cancer: We have had an extensive discussion regarding management options for his low risk prostate cancer.  Ultimately, he has decided to proceed with surgical treatment understanding the potential risks of therapy related to urinary function and erectile function.  He will plan to undergo a bilateral nerve sparing robot-assisted laparoscopic radical prostatectomy.  Informed consent has been obtained.

## 2016-08-06 MED ORDER — DEXTROSE 5 % IV SOLN
3.0000 g | INTRAVENOUS | Status: AC
  Administered 2016-08-07: 3 g via INTRAVENOUS
  Filled 2016-08-06: qty 3

## 2016-08-07 ENCOUNTER — Encounter (HOSPITAL_COMMUNITY)
Admission: RE | Disposition: A | Discharge status: Home / Self Care | Payer: Self-pay | Source: Ambulatory Visit | Attending: Urology

## 2016-08-07 ENCOUNTER — Inpatient Hospital Stay (HOSPITAL_COMMUNITY): Payer: BLUE CROSS/BLUE SHIELD | Admitting: Anesthesiology

## 2016-08-07 ENCOUNTER — Inpatient Hospital Stay (HOSPITAL_COMMUNITY)
Admission: RE | Admit: 2016-08-07 | Discharge: 2016-08-08 | DRG: 708 | Disposition: A | Discharge status: Home / Self Care | Payer: BLUE CROSS/BLUE SHIELD | Source: Ambulatory Visit | Attending: Urology | Admitting: Urology

## 2016-08-07 ENCOUNTER — Encounter (HOSPITAL_COMMUNITY): Payer: Self-pay | Admitting: *Deleted

## 2016-08-07 DIAGNOSIS — E7800 Pure hypercholesterolemia, unspecified: Secondary | ICD-10-CM | POA: Diagnosis present

## 2016-08-07 DIAGNOSIS — Z79899 Other long term (current) drug therapy: Secondary | ICD-10-CM

## 2016-08-07 DIAGNOSIS — I10 Essential (primary) hypertension: Secondary | ICD-10-CM | POA: Diagnosis present

## 2016-08-07 DIAGNOSIS — Z7982 Long term (current) use of aspirin: Secondary | ICD-10-CM

## 2016-08-07 DIAGNOSIS — Z8249 Family history of ischemic heart disease and other diseases of the circulatory system: Secondary | ICD-10-CM | POA: Diagnosis not present

## 2016-08-07 DIAGNOSIS — Z6836 Body mass index (BMI) 36.0-36.9, adult: Secondary | ICD-10-CM | POA: Diagnosis not present

## 2016-08-07 DIAGNOSIS — C61 Malignant neoplasm of prostate: Secondary | ICD-10-CM | POA: Diagnosis not present

## 2016-08-07 DIAGNOSIS — E291 Testicular hypofunction: Secondary | ICD-10-CM | POA: Diagnosis not present

## 2016-08-07 DIAGNOSIS — N529 Male erectile dysfunction, unspecified: Secondary | ICD-10-CM | POA: Diagnosis present

## 2016-08-07 HISTORY — PX: ROBOT ASSISTED LAPAROSCOPIC RADICAL PROSTATECTOMY: SHX5141

## 2016-08-07 LAB — TYPE AND SCREEN
ABO/RH(D): O POS
ANTIBODY SCREEN: NEGATIVE

## 2016-08-07 LAB — ABO/RH: ABO/RH(D): O POS

## 2016-08-07 LAB — HEMOGLOBIN AND HEMATOCRIT, BLOOD
HCT: 42.6 % (ref 39.0–52.0)
Hemoglobin: 14.4 g/dL (ref 13.0–17.0)

## 2016-08-07 SURGERY — ROBOTIC ASSISTED LAPAROSCOPIC RADICAL PROSTATECTOMY LEVEL 1
Anesthesia: General

## 2016-08-07 MED ORDER — SUCCINYLCHOLINE CHLORIDE 200 MG/10ML IV SOSY
PREFILLED_SYRINGE | INTRAVENOUS | Status: DC | PRN
  Administered 2016-08-07: 140 mg via INTRAVENOUS

## 2016-08-07 MED ORDER — LIDOCAINE 2% (20 MG/ML) 5 ML SYRINGE
INTRAMUSCULAR | Status: DC | PRN
  Administered 2016-08-07: 100 mg via INTRAVENOUS

## 2016-08-07 MED ORDER — ROCURONIUM BROMIDE 10 MG/ML (PF) SYRINGE
PREFILLED_SYRINGE | INTRAVENOUS | Status: DC | PRN
  Administered 2016-08-07 (×2): 20 mg via INTRAVENOUS
  Administered 2016-08-07: 50 mg via INTRAVENOUS

## 2016-08-07 MED ORDER — DEXAMETHASONE SODIUM PHOSPHATE 10 MG/ML IJ SOLN
INTRAMUSCULAR | Status: AC
  Filled 2016-08-07: qty 1

## 2016-08-07 MED ORDER — PROPOFOL 10 MG/ML IV BOLUS
INTRAVENOUS | Status: DC | PRN
  Administered 2016-08-07: 300 mg via INTRAVENOUS

## 2016-08-07 MED ORDER — FENTANYL CITRATE (PF) 250 MCG/5ML IJ SOLN
INTRAMUSCULAR | Status: AC
  Filled 2016-08-07: qty 5

## 2016-08-07 MED ORDER — DEXAMETHASONE SODIUM PHOSPHATE 10 MG/ML IJ SOLN
INTRAMUSCULAR | Status: DC | PRN
  Administered 2016-08-07: 10 mg via INTRAVENOUS

## 2016-08-07 MED ORDER — DOCUSATE SODIUM 100 MG PO CAPS
100.0000 mg | ORAL_CAPSULE | Freq: Two times a day (BID) | ORAL | Status: DC
  Administered 2016-08-07 – 2016-08-08 (×2): 100 mg via ORAL
  Filled 2016-08-07 (×2): qty 1

## 2016-08-07 MED ORDER — AMLODIPINE BESYLATE 5 MG PO TABS
5.0000 mg | ORAL_TABLET | Freq: Every day | ORAL | Status: DC
  Administered 2016-08-08: 5 mg via ORAL
  Filled 2016-08-07: qty 1

## 2016-08-07 MED ORDER — KETOROLAC TROMETHAMINE 15 MG/ML IJ SOLN
15.0000 mg | Freq: Four times a day (QID) | INTRAMUSCULAR | Status: DC
  Administered 2016-08-07 – 2016-08-08 (×4): 15 mg via INTRAVENOUS
  Filled 2016-08-07 (×4): qty 1

## 2016-08-07 MED ORDER — VITAMINS A & D EX OINT
TOPICAL_OINTMENT | CUTANEOUS | Status: AC
  Administered 2016-08-07: 5
  Filled 2016-08-07: qty 5

## 2016-08-07 MED ORDER — CIPROFLOXACIN HCL 500 MG PO TABS: 500.0000 mg | ORAL_TABLET | Freq: Two times a day (BID) | ORAL | 0 refills | Status: DC

## 2016-08-07 MED ORDER — SUGAMMADEX SODIUM 500 MG/5ML IV SOLN
INTRAVENOUS | Status: AC
  Filled 2016-08-07: qty 5

## 2016-08-07 MED ORDER — LIDOCAINE 2% (20 MG/ML) 5 ML SYRINGE
INTRAMUSCULAR | Status: AC
  Filled 2016-08-07: qty 5

## 2016-08-07 MED ORDER — STERILE WATER FOR IRRIGATION IR SOLN
Status: DC | PRN
  Administered 2016-08-07: 1000 mL

## 2016-08-07 MED ORDER — HYDROMORPHONE HCL 1 MG/ML IJ SOLN
0.2500 mg | INTRAMUSCULAR | Status: DC | PRN
  Administered 2016-08-07 (×2): 0.5 mg via INTRAVENOUS

## 2016-08-07 MED ORDER — ROCURONIUM BROMIDE 50 MG/5ML IV SOSY
PREFILLED_SYRINGE | INTRAVENOUS | Status: AC
  Filled 2016-08-07: qty 5

## 2016-08-07 MED ORDER — SODIUM CHLORIDE 0.9 % IV BOLUS (SEPSIS)
1000.0000 mL | Freq: Once | INTRAVENOUS | Status: AC
  Administered 2016-08-07: 1000 mL via INTRAVENOUS

## 2016-08-07 MED ORDER — ONDANSETRON HCL 4 MG/2ML IJ SOLN
INTRAMUSCULAR | Status: AC
  Filled 2016-08-07: qty 2

## 2016-08-07 MED ORDER — CEFAZOLIN IN D5W 1 GM/50ML IV SOLN
1.0000 g | Freq: Three times a day (TID) | INTRAVENOUS | Status: AC
  Administered 2016-08-07 – 2016-08-08 (×2): 1 g via INTRAVENOUS
  Filled 2016-08-07 (×2): qty 50

## 2016-08-07 MED ORDER — MIDAZOLAM HCL 5 MG/5ML IJ SOLN
INTRAMUSCULAR | Status: DC | PRN
  Administered 2016-08-07: 2 mg via INTRAVENOUS

## 2016-08-07 MED ORDER — SUGAMMADEX SODIUM 500 MG/5ML IV SOLN
INTRAVENOUS | Status: DC | PRN
  Administered 2016-08-07: 400 mg via INTRAVENOUS

## 2016-08-07 MED ORDER — DIPHENHYDRAMINE HCL 12.5 MG/5ML PO ELIX: 12.5000 mg | ORAL_SOLUTION | Freq: Four times a day (QID) | ORAL | Status: DC | PRN

## 2016-08-07 MED ORDER — HYDROMORPHONE HCL 1 MG/ML IJ SOLN
INTRAMUSCULAR | Status: DC | PRN
  Administered 2016-08-07 (×2): 1 mg via INTRAVENOUS

## 2016-08-07 MED ORDER — HYDROMORPHONE HCL 2 MG/ML IJ SOLN
INTRAMUSCULAR | Status: AC
  Filled 2016-08-07: qty 1

## 2016-08-07 MED ORDER — BENAZEPRIL HCL 10 MG PO TABS
10.0000 mg | ORAL_TABLET | Freq: Every day | ORAL | Status: DC
  Administered 2016-08-08: 10 mg via ORAL
  Filled 2016-08-07: qty 1

## 2016-08-07 MED ORDER — MORPHINE SULFATE (PF) 10 MG/ML IV SOLN
2.0000 mg | INTRAVENOUS | Status: DC | PRN
  Administered 2016-08-07: 2 mg via INTRAVENOUS
  Filled 2016-08-07: qty 1

## 2016-08-07 MED ORDER — BUPIVACAINE HCL (PF) 0.25 % IJ SOLN
INTRAMUSCULAR | Status: AC
  Filled 2016-08-07: qty 30

## 2016-08-07 MED ORDER — HYDROCODONE-ACETAMINOPHEN 5-325 MG PO TABS: 1.0000 | ORAL_TABLET | Freq: Four times a day (QID) | ORAL | 0 refills | Status: DC | PRN

## 2016-08-07 MED ORDER — LACTATED RINGERS IV SOLN
INTRAVENOUS | Status: DC
  Administered 2016-08-07: 15:00:00 via INTRAVENOUS
  Administered 2016-08-07: 100 mL via INTRAVENOUS
  Administered 2016-08-07: 13:00:00 via INTRAVENOUS

## 2016-08-07 MED ORDER — MIDAZOLAM HCL 2 MG/2ML IJ SOLN
INTRAMUSCULAR | Status: AC
  Filled 2016-08-07: qty 2

## 2016-08-07 MED ORDER — KCL IN DEXTROSE-NACL 20-5-0.45 MEQ/L-%-% IV SOLN
INTRAVENOUS | Status: DC
  Administered 2016-08-07 (×2): via INTRAVENOUS
  Filled 2016-08-07 (×3): qty 1000

## 2016-08-07 MED ORDER — ATORVASTATIN CALCIUM 20 MG PO TABS
20.0000 mg | ORAL_TABLET | Freq: Every day | ORAL | Status: DC
  Administered 2016-08-07 – 2016-08-08 (×2): 20 mg via ORAL
  Filled 2016-08-07 (×2): qty 1

## 2016-08-07 MED ORDER — AMLODIPINE BESYLATE 5 MG PO TABS
5.0000 mg | ORAL_TABLET | Freq: Once | ORAL | Status: AC
  Administered 2016-08-07: 5 mg via ORAL
  Filled 2016-08-07: qty 1

## 2016-08-07 MED ORDER — PROPOFOL 10 MG/ML IV BOLUS
INTRAVENOUS | Status: AC
  Filled 2016-08-07: qty 20

## 2016-08-07 MED ORDER — DIPHENHYDRAMINE HCL 50 MG/ML IJ SOLN: 12.5000 mg | Freq: Four times a day (QID) | INTRAMUSCULAR | Status: DC | PRN

## 2016-08-07 MED ORDER — AMLODIPINE BESY-BENAZEPRIL HCL 5-10 MG PO CAPS: 1.0000 | ORAL_CAPSULE | Freq: Every day | ORAL | Status: DC

## 2016-08-07 MED ORDER — PROMETHAZINE HCL 25 MG/ML IJ SOLN: 6.2500 mg | INTRAMUSCULAR | Status: DC | PRN

## 2016-08-07 MED ORDER — SODIUM CHLORIDE 0.9 % IR SOLN
Status: DC | PRN
  Administered 2016-08-07: 1000 mL

## 2016-08-07 MED ORDER — ACETAMINOPHEN 325 MG PO TABS: 650.0000 mg | ORAL_TABLET | ORAL | Status: DC | PRN

## 2016-08-07 MED ORDER — FENTANYL CITRATE (PF) 100 MCG/2ML IJ SOLN
INTRAMUSCULAR | Status: DC | PRN
  Administered 2016-08-07: 50 ug via INTRAVENOUS
  Administered 2016-08-07 (×2): 100 ug via INTRAVENOUS

## 2016-08-07 MED ORDER — ONDANSETRON HCL 4 MG/2ML IJ SOLN
INTRAMUSCULAR | Status: DC | PRN
  Administered 2016-08-07: 4 mg via INTRAVENOUS

## 2016-08-07 MED ORDER — BUPIVACAINE HCL (PF) 0.25 % IJ SOLN
INTRAMUSCULAR | Status: DC | PRN
  Administered 2016-08-07: 30 mL

## 2016-08-07 MED ORDER — HEPARIN SODIUM (PORCINE) 1000 UNIT/ML IJ SOLN
INTRAMUSCULAR | Status: AC
  Filled 2016-08-07: qty 1

## 2016-08-07 MED ORDER — SUCCINYLCHOLINE CHLORIDE 200 MG/10ML IV SOSY
PREFILLED_SYRINGE | INTRAVENOUS | Status: AC
Start: 2016-08-07 — End: 2016-08-07
  Filled 2016-08-07: qty 10

## 2016-08-07 SURGICAL SUPPLY — 52 items
ADH SKN CLS APL DERMABOND .7 (GAUZE/BANDAGES/DRESSINGS)
APPLICATOR COTTON TIP 6IN STRL (MISCELLANEOUS) ×2 IMPLANT
CATH FOLEY 2WAY SLVR 18FR 30CC (CATHETERS) ×2 IMPLANT
CATH ROBINSON RED A/P 16FR (CATHETERS) ×2 IMPLANT
CATH ROBINSON RED A/P 8FR (CATHETERS) ×2 IMPLANT
CATH TIEMANN FOLEY 18FR 5CC (CATHETERS) ×2 IMPLANT
CHLORAPREP W/TINT 26ML (MISCELLANEOUS) ×2 IMPLANT
CLIP LIGATING HEM O LOK PURPLE (MISCELLANEOUS) ×4 IMPLANT
COVER SURGICAL LIGHT HANDLE (MISCELLANEOUS) ×2 IMPLANT
COVER TIP SHEARS 8 DVNC (MISCELLANEOUS) ×1 IMPLANT
COVER TIP SHEARS 8MM DA VINCI (MISCELLANEOUS) ×1
CUTTER ECHEON FLEX ENDO 45 340 (ENDOMECHANICALS) ×2 IMPLANT
DECANTER SPIKE VIAL GLASS SM (MISCELLANEOUS) ×2 IMPLANT
DERMABOND ADVANCED (GAUZE/BANDAGES/DRESSINGS)
DERMABOND ADVANCED .7 DNX12 (GAUZE/BANDAGES/DRESSINGS) IMPLANT
DRAPE ARM DVNC X/XI (DISPOSABLE) ×4 IMPLANT
DRAPE COLUMN DVNC XI (DISPOSABLE) ×1 IMPLANT
DRAPE DA VINCI XI ARM (DISPOSABLE) ×4
DRAPE DA VINCI XI COLUMN (DISPOSABLE) ×1
DRAPE SURG IRRIG POUCH 19X23 (DRAPES) ×2 IMPLANT
DRSG TEGADERM 4X4.75 (GAUZE/BANDAGES/DRESSINGS) ×2 IMPLANT
ELECT REM PT RETURN 9FT ADLT (ELECTROSURGICAL) ×2
ELECTRODE REM PT RTRN 9FT ADLT (ELECTROSURGICAL) ×1 IMPLANT
GLOVE BIO SURGEON STRL SZ 6.5 (GLOVE) ×2 IMPLANT
GLOVE BIOGEL M STRL SZ7.5 (GLOVE) ×4 IMPLANT
GOWN STRL REUS W/TWL LRG LVL3 (GOWN DISPOSABLE) ×6 IMPLANT
HOLDER FOLEY CATH W/STRAP (MISCELLANEOUS) ×2 IMPLANT
IRRIG SUCT STRYKERFLOW 2 WTIP (MISCELLANEOUS) ×2
IRRIGATION SUCT STRKRFLW 2 WTP (MISCELLANEOUS) ×1 IMPLANT
IV LACTATED RINGERS 1000ML (IV SOLUTION) ×2 IMPLANT
NDL SAFETY ECLIPSE 18X1.5 (NEEDLE) ×1 IMPLANT
NEEDLE HYPO 18GX1.5 SHARP (NEEDLE) ×2
PACK ROBOT UROLOGY CUSTOM (CUSTOM PROCEDURE TRAY) ×2 IMPLANT
RELOAD GREEN ECHELON 45 (STAPLE) ×2 IMPLANT
SEAL CANN UNIV 5-8 DVNC XI (MISCELLANEOUS) ×4 IMPLANT
SEAL XI 5MM-8MM UNIVERSAL (MISCELLANEOUS) ×4
SOLUTION ELECTROLUBE (MISCELLANEOUS) ×2 IMPLANT
SUT ETHILON 3 0 PS 1 (SUTURE) ×2 IMPLANT
SUT MNCRL 3 0 RB1 (SUTURE) ×1 IMPLANT
SUT MNCRL 3 0 VIOLET RB1 (SUTURE) ×1 IMPLANT
SUT MNCRL AB 4-0 PS2 18 (SUTURE) ×4 IMPLANT
SUT MONOCRYL 3 0 RB1 (SUTURE) ×2
SUT VIC AB 0 CT1 27 (SUTURE) ×2
SUT VIC AB 0 CT1 27XBRD ANTBC (SUTURE) ×1 IMPLANT
SUT VIC AB 0 UR5 27 (SUTURE) ×2 IMPLANT
SUT VIC AB 2-0 SH 27 (SUTURE) ×2
SUT VIC AB 2-0 SH 27X BRD (SUTURE) ×1 IMPLANT
SUT VICRYL 0 UR6 27IN ABS (SUTURE) ×4 IMPLANT
SYR 27GX1/2 1ML LL SAFETY (SYRINGE) ×2 IMPLANT
TOWEL OR 17X26 10 PK STRL BLUE (TOWEL DISPOSABLE) ×2 IMPLANT
TOWEL OR NON WOVEN STRL DISP B (DISPOSABLE) ×2 IMPLANT
WATER STERILE IRR 1500ML POUR (IV SOLUTION) ×4 IMPLANT

## 2016-08-07 NOTE — Anesthesia Preprocedure Evaluation (Addendum)
Anesthesia Evaluation  Patient identified by MRN, date of birth, ID band Patient awake    Reviewed: Allergy & Precautions, NPO status , Patient's Chart, lab work & pertinent test results  History of Anesthesia Complications Negative for: history of anesthetic complications  Airway Mallampati: II  TM Distance: >3 FB Neck ROM: Full    Dental no notable dental hx. (+) Dental Advisory Given   Pulmonary neg pulmonary ROS,    Pulmonary exam normal breath sounds clear to auscultation       Cardiovascular hypertension, Pt. on medications Normal cardiovascular exam Rhythm:Regular Rate:Normal     Neuro/Psych negative neurological ROS  negative psych ROS   GI/Hepatic negative GI ROS, Neg liver ROS,   Endo/Other  Morbid obesity  Renal/GU negative Renal ROS  negative genitourinary   Musculoskeletal negative musculoskeletal ROS (+)   Abdominal   Peds negative pediatric ROS (+)  Hematology negative hematology ROS (+)   Anesthesia Other Findings   Reproductive/Obstetrics negative OB ROS                            Anesthesia Physical Anesthesia Plan  ASA: II  Anesthesia Plan: General   Post-op Pain Management:    Induction: Intravenous  Airway Management Planned: Oral ETT  Additional Equipment:   Intra-op Plan:   Post-operative Plan: Extubation in OR  Informed Consent: I have reviewed the patients History and Physical, chart, labs and discussed the procedure including the risks, benefits and alternatives for the proposed anesthesia with the patient or authorized representative who has indicated his/her understanding and acceptance.   Dental advisory given  Plan Discussed with: CRNA and Surgeon  Anesthesia Plan Comments:         Anesthesia Quick Evaluation

## 2016-08-07 NOTE — Op Note (Signed)
Preoperative diagnosis: Clinically localized adenocarcinoma of the prostate (clinical stage T1c Nx Mx)  Postoperative diagnosis: Clinically localized adenocarcinoma of the prostate (clinical stage T1c Nx Mx)  Procedure:  1. Robotic assisted laparoscopic radical prostatectomy (bilateral nerve sparing)  Surgeon: Roxy Horseman, Brooke Bonito. M.D.  Assistant: Debbrah Alar, PA-C  An assistant was required for this surgical procedure.  The duties of the assistant included but were not limited to suctioning, passing suture, camera manipulation, retraction. This procedure would not be able to be performed without an Environmental consultant.  Resident: Dr. Virginia Crews  Anesthesia: General  Complications: None  EBL: 100 mL  IVF:  2000 mL crystalloid  Specimens: 1. Prostate and seminal vesicles  Disposition of specimens: Pathology  Drains: 1. 20 Fr coude catheter 2. # 19 Blake pelvic drain  Indication: Austin Moses is a 61 y.o. year old patient with clinically localized prostate cancer.  After a thorough review of the management options for treatment of prostate cancer, he elected to proceed with surgical therapy and the above procedure(s).  We have discussed the potential benefits and risks of the procedure, side effects of the proposed treatment, the likelihood of the patient achieving the goals of the procedure, and any potential problems that might occur during the procedure or recuperation. Informed consent has been obtained.  Description of procedure:  The patient was taken to the operating room and a general anesthetic was administered. He was given preoperative antibiotics, placed in the dorsal lithotomy position, and prepped and draped in the usual sterile fashion. Next a preoperative timeout was performed. A urethral catheter was placed into the bladder and a site was selected near the umbilicus for placement of the camera port. This was placed using a standard open Hassan technique which allowed  entry into the peritoneal cavity under direct vision and without difficulty. An 8 mm port was placed and a pneumoperitoneum established. The camera was then used to inspect the abdomen and there was no evidence of any intra-abdominal injuries or other abnormalities. The remaining abdominal ports were then placed. 8 mm robotic ports were placed in the right lower quadrant, left lower quadrant, and far left lateral abdominal wall. A 5 mm port was placed in the right upper quadrant and a 12 mm port was placed in the right lateral abdominal wall for laparoscopic assistance. All ports were placed under direct vision without difficulty. The surgical cart was then docked.   Utilizing the cautery scissors, the bladder was reflected posteriorly allowing entry into the space of Retzius and identification of the endopelvic fascia and prostate. The periprostatic fat was then removed from the prostate allowing full exposure of the endopelvic fascia. The endopelvic fascia was then incised from the apex back to the base of the prostate bilaterally and the underlying levator muscle fibers were swept laterally off the prostate thereby isolating the dorsal venous complex. The dorsal vein was then stapled and divided with a 45 mm Flex Echelon stapler. Attention then turned to the bladder neck which was divided anteriorly thereby allowing entry into the bladder and exposure of the urethral catheter. The catheter balloon was deflated and the catheter was brought into the operative field and used to retract the prostate anteriorly. The posterior bladder neck was then examined and was divided allowing further dissection between the bladder and prostate posteriorly until the vasa deferentia and seminal vessels were identified. The vasa deferentia were isolated, divided, and lifted anteriorly. The seminal vesicles were dissected down to their tips with care to control  the seminal vascular arterial blood supply. These structures were then  lifted anteriorly and the space between Denonvillier's fascia and the anterior rectum was developed with a combination of sharp and blunt dissection. This isolated the vascular pedicles of the prostate.  The lateral prostatic fascia was then sharply incised allowing release of the neurovascular bundles bilaterally. The vascular pedicles of the prostate were then ligated with Weck clips between the prostate and neurovascular bundles and divided with sharp cold scissor dissection resulting in neurovascular bundle preservation. The neurovascular bundles were then separated off the apex of the prostate and urethra bilaterally.  The urethra was then sharply transected allowing the prostate specimen to be disarticulated. The pelvis was copiously irrigated and hemostasis was ensured. There was no evidence for rectal injury.  Attention then turned to the urethral anastomosis. A 2-0 Vicryl slip knot was placed between Denonvillier's fascia, the posterior bladder neck, and the posterior urethra to reapproximate these structures. A double-armed 3-0 Monocryl suture was then used to perform a 360 running tension-free anastomosis between the bladder neck and urethra. A new urethral catheter was then placed into the bladder and irrigated. There were no blood clots within the bladder and the anastomosis appeared to be watertight. A #19 Blake drain was then brought through the left lateral 8 mm port site and positioned appropriately within the pelvis. It was secured to the skin with a nylon suture. The surgical cart was then undocked. The right lateral 12 mm port site was closed at the fascial level with a 0 Vicryl suture placed laparoscopically. All remaining ports were then removed under direct vision. The prostate specimen was removed intact within the Endopouch retrieval bag via the periumbilical camera port site. This fascial opening was closed with two running 0 Vicryl sutures. 0.25% Marcaine was then injected into all  port sites and all incisions were reapproximated at the skin level with 4-0 Monocryl subcuticular sutures and liquid skin adhesive. The patient appeared to tolerate the procedure well and without complications. The patient was able to be extubated and transferred to the recovery unit in satisfactory condition.  Pryor Curia MD

## 2016-08-07 NOTE — Discharge Instructions (Signed)

## 2016-08-07 NOTE — Transfer of Care (Signed)
Immediate Anesthesia Transfer of Care Note  Patient: Austin Moses  Procedure(s) Performed: Procedure(s): ROBOTIC ASSISTED LAPAROSCOPIC RADICAL PROSTATECTOMY LEVEL 1 (N/A)  Patient Location: PACU  Anesthesia Type:General  Level of Consciousness: sedated  Airway & Oxygen Therapy: Patient Spontanous Breathing and Patient connected to face mask oxygen  Post-op Assessment: Report given to RN and Post -op Vital signs reviewed and stable  Post vital signs: Reviewed and stable  Last Vitals:  Vitals:   08/07/16 0937  BP: (!) 154/95  Pulse: 77  Resp: 18  Temp: 36.6 C    Last Pain:  Vitals:   08/07/16 0937  TempSrc: Oral      Patients Stated Pain Goal: 4 ([phone])  Complications: No apparent anesthesia complications

## 2016-08-07 NOTE — Anesthesia Postprocedure Evaluation (Addendum)
Anesthesia Post Note  Patient: Kerel Zeiders Choo  Procedure(s) Performed: Procedure(s) (LRB): ROBOTIC ASSISTED LAPAROSCOPIC RADICAL PROSTATECTOMY LEVEL 1 (N/A)  Patient location during evaluation: PACU Anesthesia Type: General Level of consciousness: sedated Pain management: pain level controlled Vital Signs Assessment: post-procedure vital signs reviewed and stable Respiratory status: spontaneous breathing and respiratory function stable Cardiovascular status: stable Anesthetic complications: no       Last Vitals:  Vitals:   08/07/16 1545 08/07/16 1600  BP: (!) 146/90 (!) 156/91  Pulse: 84 85  Resp: 17 14  Temp:      Last Pain:  Vitals:   08/07/16 1600  TempSrc:   PainSc: 6                  Andreyah Natividad DANIEL

## 2016-08-07 NOTE — Anesthesia Procedure Notes (Signed)
Procedure Name: Intubation Date/Time: 08/07/2016 12:15 PM Performed by: Lind Covert Pre-anesthesia Checklist: Patient identified, Emergency Drugs available, Suction available, Patient being monitored and Timeout performed Patient Re-evaluated:Patient Re-evaluated prior to inductionOxygen Delivery Method: Circle system utilized Preoxygenation: Pre-oxygenation with 100% oxygen Intubation Type: IV induction Laryngoscope Size: Mac and 4 Grade View: Grade I Tube type: Oral Tube size: 7.5 mm Number of attempts: 1 Airway Equipment and Method: Stylet Placement Confirmation: ETT inserted through vocal cords under direct vision,  positive ETCO2 and breath sounds checked- equal and bilateral Secured at: 22 cm Tube secured with: Tape Dental Injury: Teeth and Oropharynx as per pre-operative assessment

## 2016-08-08 LAB — HEMOGLOBIN AND HEMATOCRIT, BLOOD
HCT: 37.9 % — ABNORMAL LOW (ref 39.0–52.0)
Hemoglobin: 12.8 g/dL — ABNORMAL LOW (ref 13.0–17.0)

## 2016-08-08 MED ORDER — BISACODYL 10 MG RE SUPP
10.0000 mg | Freq: Once | RECTAL | Status: AC
  Administered 2016-08-08: 10 mg via RECTAL
  Filled 2016-08-08: qty 1

## 2016-08-08 MED ORDER — HYDROCODONE-ACETAMINOPHEN 5-325 MG PO TABS
1.0000 | ORAL_TABLET | Freq: Four times a day (QID) | ORAL | Status: DC | PRN
  Administered 2016-08-08: 1 via ORAL
  Filled 2016-08-08: qty 1

## 2016-08-08 NOTE — Discharge Summary (Signed)
  Date of admission: 08/07/2016  Date of discharge: 08/08/2016  Admission diagnosis: Prostate Cancer  Discharge diagnosis: Prostate Cancer  History and Physical: For full details, please see admission history and physical. Briefly, PER Austin Moses is a 61 y.o. gentleman with localized prostate cancer.  After discussing management/treatment options, he elected to proceed with surgical treatment.  Hospital Course: Austin Moses was taken to the operating room on 08/07/2016 and underwent a robotic assisted laparoscopic radical prostatectomy. He tolerated this procedure well and without complications. Postoperatively, he was able to be transferred to a regular hospital room following recovery from anesthesia.  He was able to begin ambulating the night of surgery. He remained hemodynamically stable overnight.  He had excellent urine output with appropriately minimal output from his pelvic drain and his pelvic drain was removed on POD #1.  He was transitioned to oral pain medication, tolerated a clear liquid diet, and had met all discharge criteria and was able to be discharged home later on POD#1.  Laboratory values:  Recent Labs  08/07/16 1524 08/08/16 0644  HGB 14.4 12.8*  HCT 42.6 37.9*    Disposition: Home  Discharge instruction: He was instructed to be ambulatory but to refrain from heavy lifting, strenuous activity, or driving. He was instructed on urethral catheter care.  Discharge medications:  Allergies as of 08/08/2016      Reactions   Sulfa Antibiotics Rash      Medication List    STOP taking these medications   aspirin 81 MG EC tablet     TAKE these medications   amLODipine-benazepril 5-10 MG capsule Commonly known as:  LOTREL take 1 capsule by mouth once daily What changed:  how much to take  how to take this  when to take this  additional instructions   atorvastatin 20 MG tablet Commonly known as:  LIPITOR Take 1 tablet by mouth daily.   ciprofloxacin 500 MG  tablet Commonly known as:  CIPRO Take 1 tablet (500 mg total) by mouth 2 (two) times daily. Start day prior to office visit for foley removal   HYDROcodone-acetaminophen 5-325 MG tablet Commonly known as:  NORCO Take 1-2 tablets by mouth every 6 (six) hours as needed.       Followup: He will followup in 1 week for catheter removal and to discuss his surgical pathology results.

## 2016-08-08 NOTE — Progress Notes (Signed)
Patient discharged home with wife, discharge instructions/prescrition given and explained to patient/wife and they verbalized understanding, denies any pain/distress; accompanied home by wife. Surgical incision clean/dry/intact, no sign of infection noted.

## 2016-08-08 NOTE — Care Management Note (Signed)
Case Management Note  Patient Details  Name: Austin Moses MRN: [id number] Date of Birth: [birth year]  Subjective/Objective:  61 y/o m admitted w/Prostate Ca. From home.                  Action/Plan:d/c home.   Expected Discharge Date:                 Expected Discharge Plan:  Home/Self Care  In-House Referral:     Discharge planning Services  CM Consult  Post Acute Care Choice:    Choice offered to:     DME Arranged:    DME Agency:     HH Arranged:    [place] Agency:     Status of Service:  Completed, signed off  If discussed at [employer] of Stay Meetings, dates discussed:    Additional Comments:  Dessa Phi, RN 08/08/2016, 1:42 PM

## 2016-08-08 NOTE — Progress Notes (Signed)
Patient ID: Austin Moses, male   DOB: 08-01-1955, 61 y.o.   MRN: [id number]   1 Day Post-Op Subjective: The patient is doing well.  No nausea or vomiting. Pain is adequately controlled.  Objective: Vital signs in last 24 hours: Temp:  [97.8 F (36.6 C)-99.2 F (37.3 C)] 98 F (36.7 C) (01/09 [id number]) Pulse Rate:  [63-91] 63 (01/09 0652) Resp:  [14-21] 18 (01/09 0652) BP: (120-156)/(71-106) 120/71 (01/09 0652) SpO2:  [97 %-100 %] 97 % (01/09 0652) Weight:  [127.9 kg (282 lb)] 127.9 kg (282 lb) (01/08 1630)  Intake/Output from previous day: 01/08 0701 - 01/09 0700 In: 5995 [P.O.:300; I.V.:4595; IV Piggyback:1100] Out: [id number] [Urine:3565; Drains:65; Blood:100] Intake/Output this shift: No intake/output data recorded.  Physical Exam:  General: Alert and oriented. CV: RRR Lungs: Clear bilaterally. GI: Soft, Nondistended. Incisions: Clean, dry, and intact Urine: Clear Extremities: Nontender, no erythema, no edema.  Lab Results:  Recent Labs  08/07/16 1524 08/08/16 0644  HGB 14.4 12.8*  HCT 42.6 37.9*      Assessment/Plan: POD# 1 s/p robotic prostatectomy.  1) SL IVF 2) Ambulate, Incentive spirometry 3) Transition to oral pain medication 4) Dulcolax suppository 5) D/C pelvic drain 6) Plan for likely discharge later today   Pryor Curia. MD   LOS: 1 day   [place],LES 08/08/2016, 7:37 AM

## 2016-08-08 NOTE — Progress Notes (Signed)
Patient/wife educated: demonstrated and handout given on foley/legbag care for home. Patient/wife verbalized understanding.

## 2016-09-04 DIAGNOSIS — M62838 Other muscle spasm: Secondary | ICD-10-CM | POA: Diagnosis not present

## 2016-09-04 DIAGNOSIS — N393 Stress incontinence (female) (male): Secondary | ICD-10-CM | POA: Diagnosis not present

## 2016-09-04 DIAGNOSIS — M6281 Muscle weakness (generalized): Secondary | ICD-10-CM | POA: Diagnosis not present

## 2016-09-21 DIAGNOSIS — M6281 Muscle weakness (generalized): Secondary | ICD-10-CM | POA: Diagnosis not present

## 2016-09-21 DIAGNOSIS — M62838 Other muscle spasm: Secondary | ICD-10-CM | POA: Diagnosis not present

## 2016-09-21 DIAGNOSIS — N393 Stress incontinence (female) (male): Secondary | ICD-10-CM | POA: Diagnosis not present

## 2016-10-16 DIAGNOSIS — M62838 Other muscle spasm: Secondary | ICD-10-CM | POA: Diagnosis not present

## 2016-10-16 DIAGNOSIS — N393 Stress incontinence (female) (male): Secondary | ICD-10-CM | POA: Diagnosis not present

## 2016-10-16 DIAGNOSIS — M6281 Muscle weakness (generalized): Secondary | ICD-10-CM | POA: Diagnosis not present

## 2016-10-25 DIAGNOSIS — M25562 Pain in left knee: Secondary | ICD-10-CM | POA: Diagnosis not present

## 2016-10-25 DIAGNOSIS — M1712 Unilateral primary osteoarthritis, left knee: Secondary | ICD-10-CM | POA: Diagnosis not present

## 2016-11-15 DIAGNOSIS — N393 Stress incontinence (female) (male): Secondary | ICD-10-CM | POA: Diagnosis not present

## 2016-11-15 DIAGNOSIS — M6281 Muscle weakness (generalized): Secondary | ICD-10-CM | POA: Diagnosis not present

## 2016-11-15 DIAGNOSIS — M62838 Other muscle spasm: Secondary | ICD-10-CM | POA: Diagnosis not present

## 2016-11-15 DIAGNOSIS — N5201 Erectile dysfunction due to arterial insufficiency: Secondary | ICD-10-CM | POA: Diagnosis not present

## 2016-11-15 DIAGNOSIS — C61 Malignant neoplasm of prostate: Secondary | ICD-10-CM | POA: Diagnosis not present

## 2016-12-30 NOTE — Addendum Note (Signed)
Addendum  created 12/30/16 0802 by Duane Boston, MD   Sign clinical note

## 2017-01-17 DIAGNOSIS — R7301 Impaired fasting glucose: Secondary | ICD-10-CM | POA: Diagnosis not present

## 2017-01-17 DIAGNOSIS — C61 Malignant neoplasm of prostate: Secondary | ICD-10-CM | POA: Diagnosis not present

## 2017-01-17 DIAGNOSIS — I10 Essential (primary) hypertension: Secondary | ICD-10-CM | POA: Diagnosis not present

## 2017-01-17 DIAGNOSIS — E784 Other hyperlipidemia: Secondary | ICD-10-CM | POA: Diagnosis not present

## 2017-01-17 DIAGNOSIS — Z1389 Encounter for screening for other disorder: Secondary | ICD-10-CM | POA: Diagnosis not present

## 2017-01-19 ENCOUNTER — Other Ambulatory Visit: Payer: Self-pay | Admitting: Endocrinology

## 2017-01-19 DIAGNOSIS — I10 Essential (primary) hypertension: Secondary | ICD-10-CM

## 2017-01-19 DIAGNOSIS — E785 Hyperlipidemia, unspecified: Secondary | ICD-10-CM

## 2017-02-06 ENCOUNTER — Ambulatory Visit (INDEPENDENT_AMBULATORY_CARE_PROVIDER_SITE_OTHER): Payer: BLUE CROSS/BLUE SHIELD

## 2017-02-06 DIAGNOSIS — E785 Hyperlipidemia, unspecified: Secondary | ICD-10-CM | POA: Diagnosis not present

## 2017-02-06 DIAGNOSIS — I10 Essential (primary) hypertension: Secondary | ICD-10-CM

## 2017-02-06 LAB — EXERCISE TOLERANCE TEST
CHL RATE OF PERCEIVED EXERTION: 18
CSEPED: 7 min
CSEPEDS: 52 s
CSEPEW: 9.8 METS
CSEPPHR: 137 {beats}/min
MPHR: 161 {beats}/min
Percent HR: 85 %
Rest HR: 66 {beats}/min

## 2017-04-20 DIAGNOSIS — M25562 Pain in left knee: Secondary | ICD-10-CM | POA: Diagnosis not present

## 2017-04-20 DIAGNOSIS — M1712 Unilateral primary osteoarthritis, left knee: Secondary | ICD-10-CM | POA: Diagnosis not present

## 2017-06-20 DIAGNOSIS — N5201 Erectile dysfunction due to arterial insufficiency: Secondary | ICD-10-CM | POA: Diagnosis not present

## 2017-06-20 DIAGNOSIS — C61 Malignant neoplasm of prostate: Secondary | ICD-10-CM | POA: Diagnosis not present

## 2017-06-20 DIAGNOSIS — N393 Stress incontinence (female) (male): Secondary | ICD-10-CM | POA: Diagnosis not present

## 2017-07-14 IMAGING — CR DG CHEST 2V
2 series · 2 of 2 positions shown · non-contrast
Comparison: None.

CLINICAL DATA: Preop for prostatectomy

EXAM:
CHEST  2 VIEW

[w chest pa]
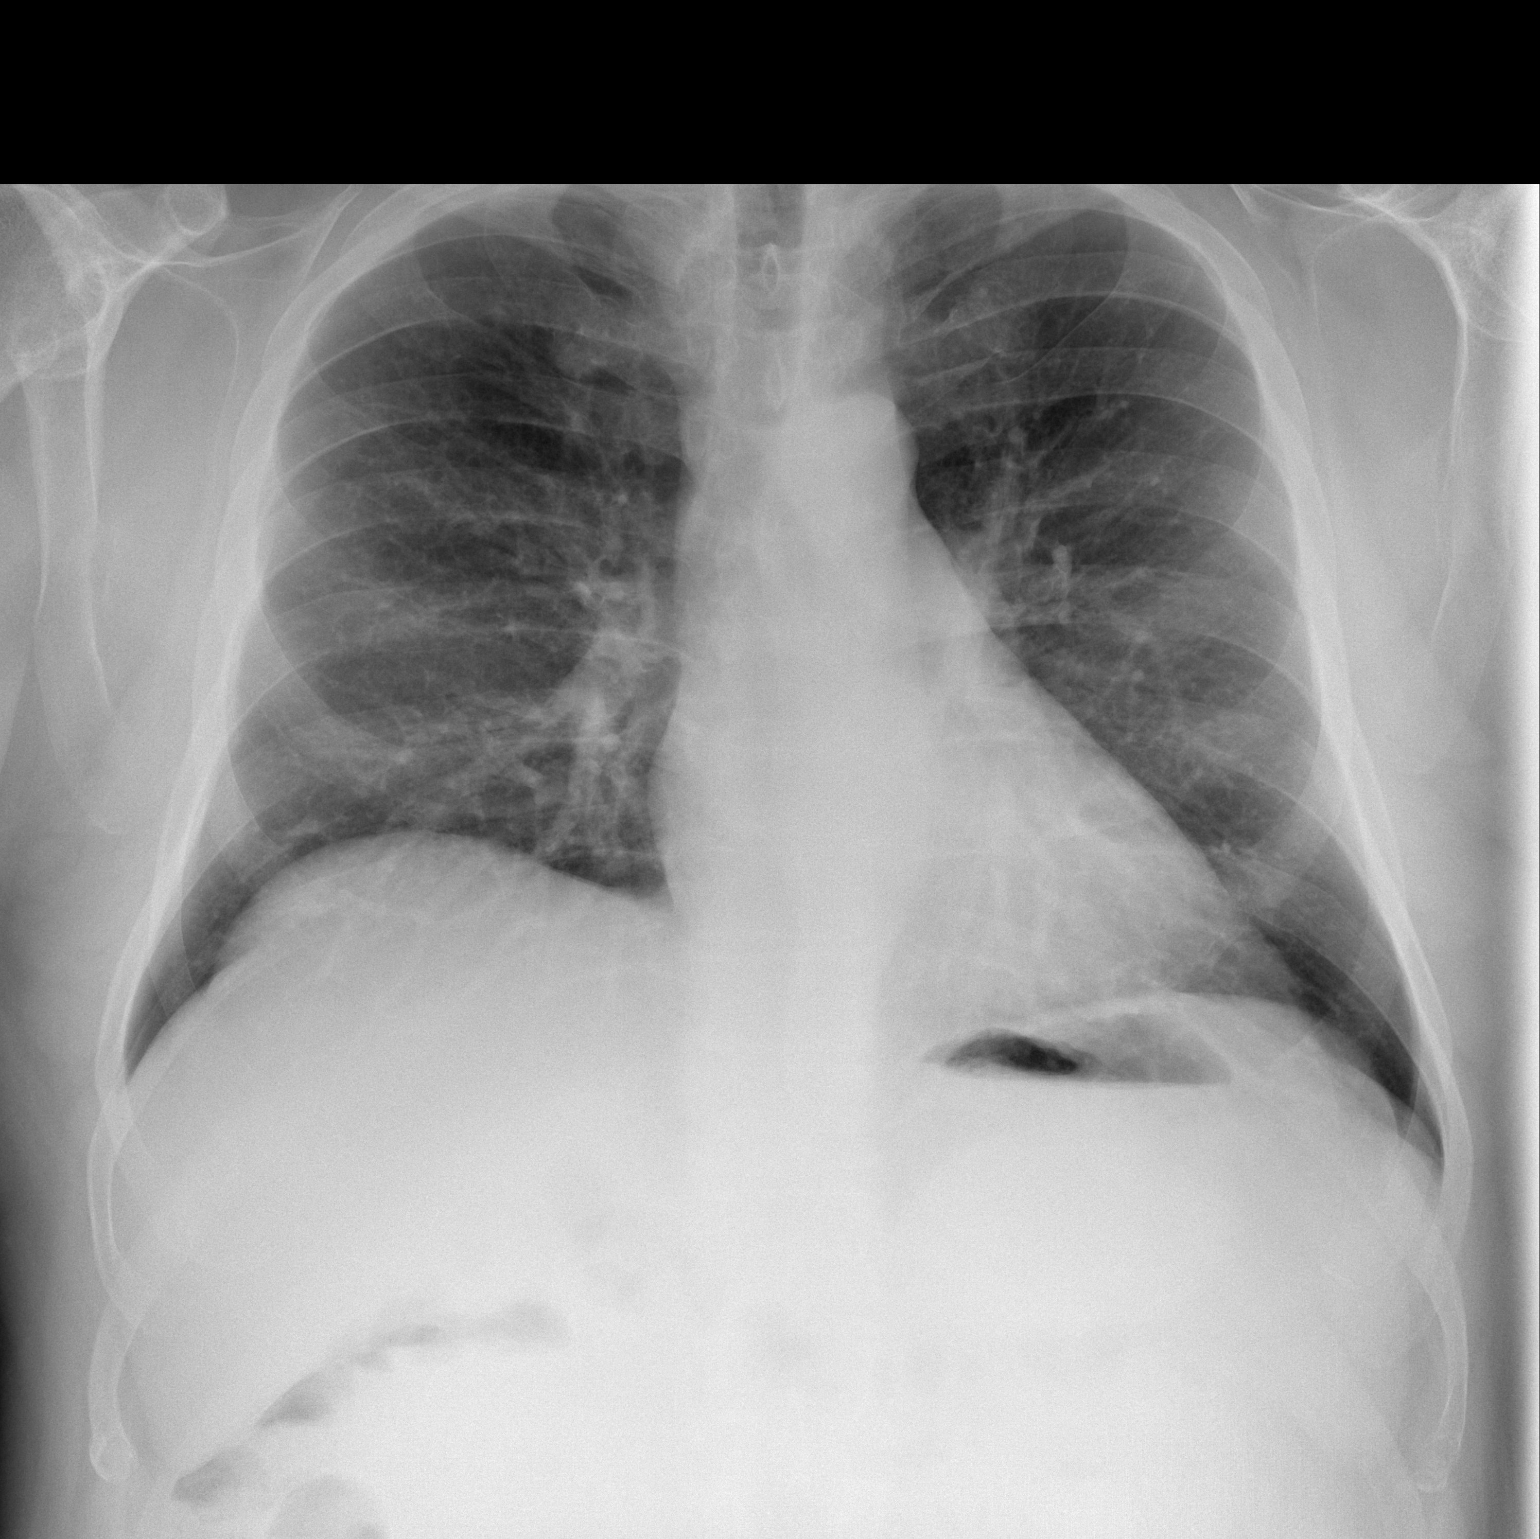

[w chest lat]
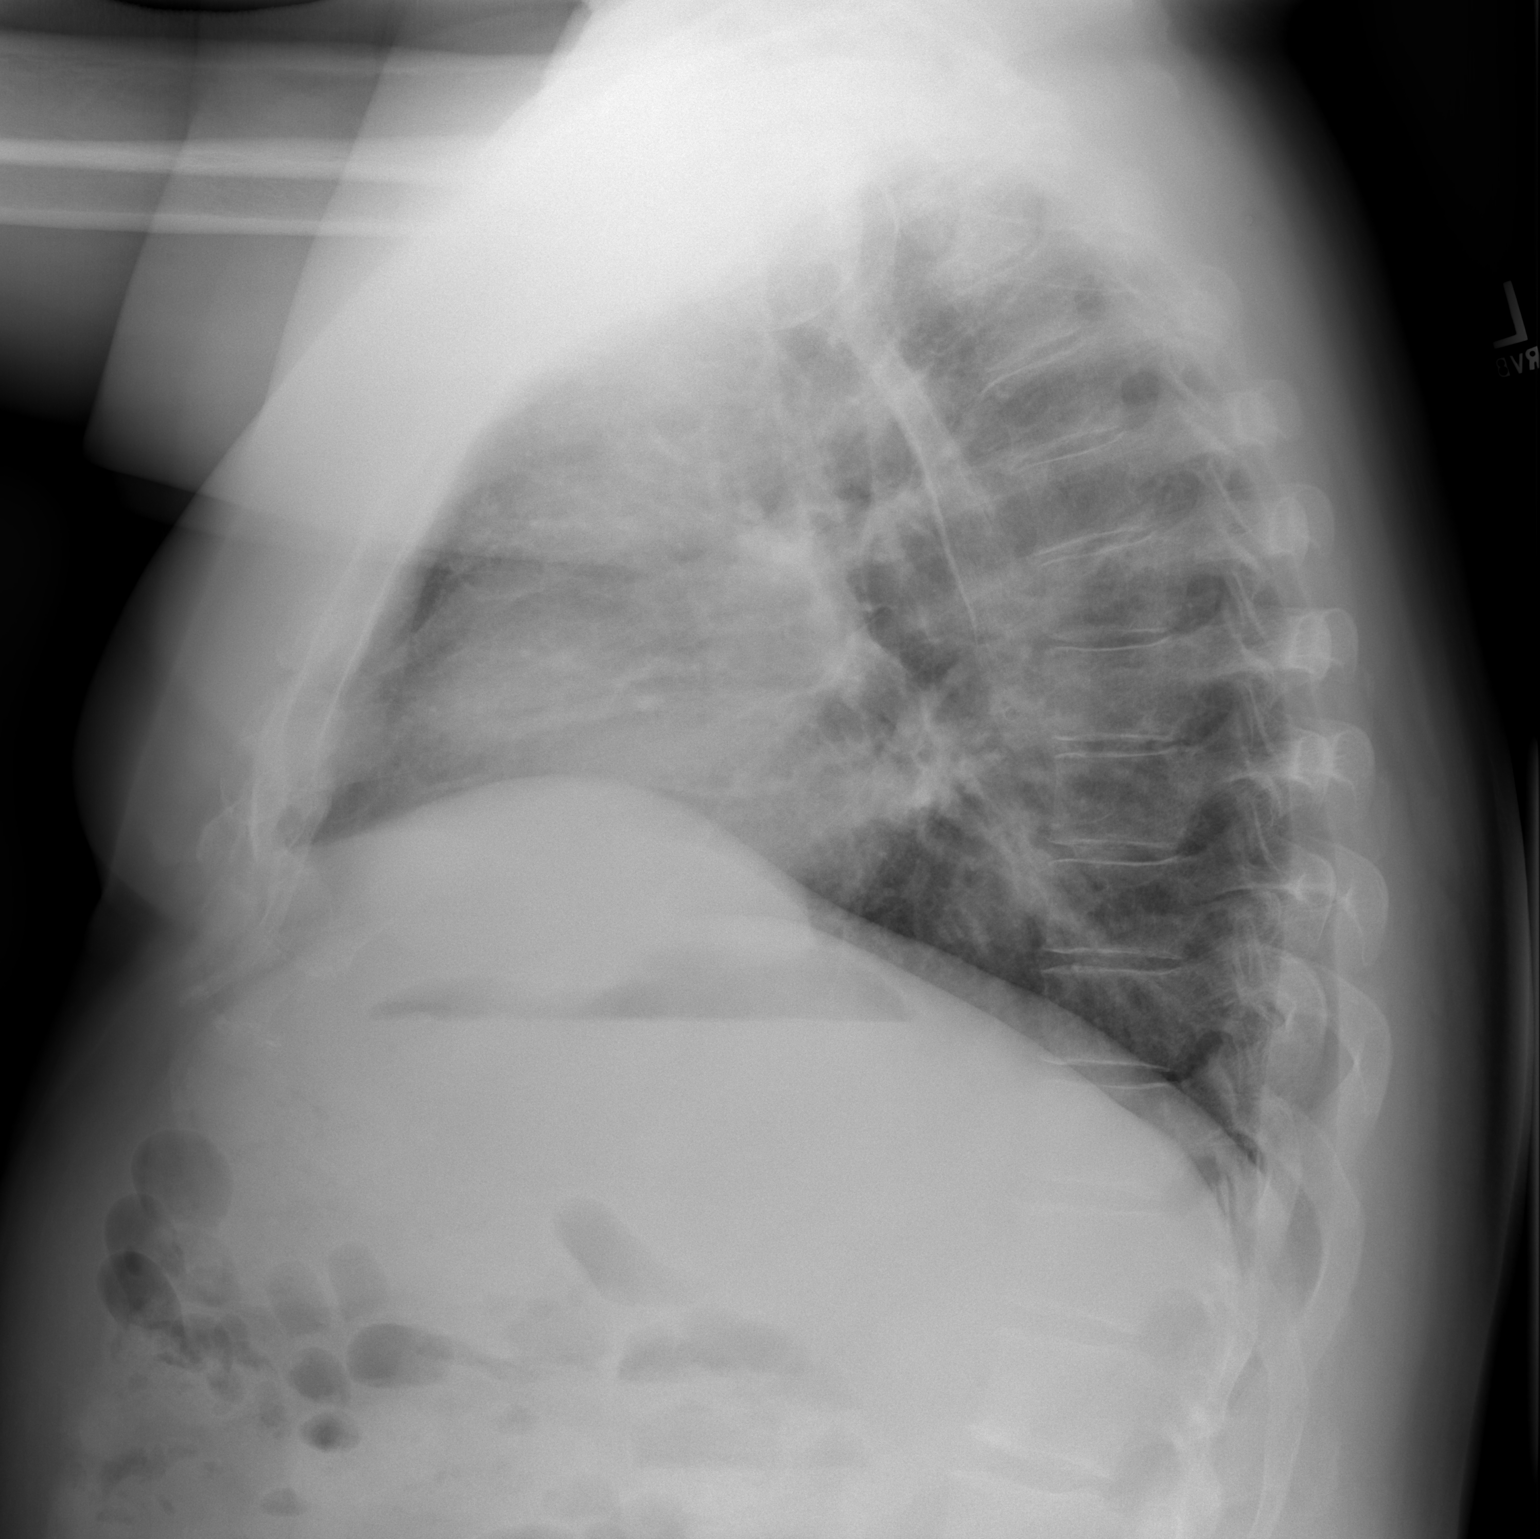

[2 of 2 positions shown; findings below may reference images not displayed]

FINDINGS: Cardiomediastinal silhouette is unremarkable. Mild elevation of the
right hemidiaphragm. No infiltrate or pleural effusion. No pulmonary
edema.
IMPRESSION: No active cardiopulmonary disease.

## 2017-07-16 DIAGNOSIS — Z23 Encounter for immunization: Secondary | ICD-10-CM | POA: Diagnosis not present

## 2017-07-16 DIAGNOSIS — C61 Malignant neoplasm of prostate: Secondary | ICD-10-CM | POA: Diagnosis not present

## 2017-07-16 DIAGNOSIS — E291 Testicular hypofunction: Secondary | ICD-10-CM | POA: Diagnosis not present

## 2017-07-16 DIAGNOSIS — I10 Essential (primary) hypertension: Secondary | ICD-10-CM | POA: Diagnosis not present

## 2017-07-16 DIAGNOSIS — R7301 Impaired fasting glucose: Secondary | ICD-10-CM | POA: Diagnosis not present

## 2017-07-16 DIAGNOSIS — E7849 Other hyperlipidemia: Secondary | ICD-10-CM | POA: Diagnosis not present

## 2017-07-16 DIAGNOSIS — R82998 Other abnormal findings in urine: Secondary | ICD-10-CM | POA: Diagnosis not present

## 2017-07-16 DIAGNOSIS — Z0000 Encounter for general adult medical examination without abnormal findings: Secondary | ICD-10-CM | POA: Diagnosis not present

## 2017-07-20 DIAGNOSIS — M1712 Unilateral primary osteoarthritis, left knee: Secondary | ICD-10-CM | POA: Diagnosis not present

## 2017-07-20 DIAGNOSIS — M25552 Pain in left hip: Secondary | ICD-10-CM | POA: Diagnosis not present

## 2017-07-20 DIAGNOSIS — M25562 Pain in left knee: Secondary | ICD-10-CM | POA: Diagnosis not present

## 2017-10-19 DIAGNOSIS — M1712 Unilateral primary osteoarthritis, left knee: Secondary | ICD-10-CM | POA: Diagnosis not present

## 2017-10-19 DIAGNOSIS — M25562 Pain in left knee: Secondary | ICD-10-CM | POA: Diagnosis not present

## 2018-01-21 DIAGNOSIS — R7301 Impaired fasting glucose: Secondary | ICD-10-CM | POA: Diagnosis not present

## 2018-01-21 DIAGNOSIS — I10 Essential (primary) hypertension: Secondary | ICD-10-CM | POA: Diagnosis not present

## 2018-01-21 DIAGNOSIS — E7849 Other hyperlipidemia: Secondary | ICD-10-CM | POA: Diagnosis not present

## 2018-01-21 DIAGNOSIS — E668 Other obesity: Secondary | ICD-10-CM | POA: Diagnosis not present

## 2018-01-21 DIAGNOSIS — M1990 Unspecified osteoarthritis, unspecified site: Secondary | ICD-10-CM | POA: Diagnosis not present

## 2018-01-21 DIAGNOSIS — Z1389 Encounter for screening for other disorder: Secondary | ICD-10-CM | POA: Diagnosis not present

## 2018-01-21 DIAGNOSIS — C61 Malignant neoplasm of prostate: Secondary | ICD-10-CM | POA: Diagnosis not present

## 2018-01-30 DIAGNOSIS — M25562 Pain in left knee: Secondary | ICD-10-CM | POA: Diagnosis not present

## 2018-01-30 DIAGNOSIS — M1712 Unilateral primary osteoarthritis, left knee: Secondary | ICD-10-CM | POA: Diagnosis not present

## 2018-05-09 DIAGNOSIS — M1712 Unilateral primary osteoarthritis, left knee: Secondary | ICD-10-CM | POA: Diagnosis not present

## 2018-05-10 DIAGNOSIS — M1712 Unilateral primary osteoarthritis, left knee: Secondary | ICD-10-CM | POA: Diagnosis not present

## 2018-05-10 DIAGNOSIS — M25562 Pain in left knee: Secondary | ICD-10-CM | POA: Diagnosis not present

## 2018-06-11 DIAGNOSIS — M1712 Unilateral primary osteoarthritis, left knee: Secondary | ICD-10-CM | POA: Diagnosis not present

## 2018-07-17 DIAGNOSIS — Z0000 Encounter for general adult medical examination without abnormal findings: Secondary | ICD-10-CM | POA: Diagnosis not present

## 2018-07-17 DIAGNOSIS — C61 Malignant neoplasm of prostate: Secondary | ICD-10-CM | POA: Diagnosis not present

## 2018-07-17 DIAGNOSIS — E559 Vitamin D deficiency, unspecified: Secondary | ICD-10-CM | POA: Diagnosis not present

## 2018-07-17 DIAGNOSIS — R82998 Other abnormal findings in urine: Secondary | ICD-10-CM | POA: Diagnosis not present

## 2018-07-17 DIAGNOSIS — I10 Essential (primary) hypertension: Secondary | ICD-10-CM | POA: Diagnosis not present

## 2018-07-17 DIAGNOSIS — R7301 Impaired fasting glucose: Secondary | ICD-10-CM | POA: Diagnosis not present

## 2018-07-17 DIAGNOSIS — E7849 Other hyperlipidemia: Secondary | ICD-10-CM | POA: Diagnosis not present

## 2018-07-17 DIAGNOSIS — E669 Obesity, unspecified: Secondary | ICD-10-CM | POA: Diagnosis not present

## 2018-07-17 DIAGNOSIS — Z1389 Encounter for screening for other disorder: Secondary | ICD-10-CM | POA: Diagnosis not present

## 2018-07-17 DIAGNOSIS — Z23 Encounter for immunization: Secondary | ICD-10-CM | POA: Diagnosis not present

## 2018-08-06 DIAGNOSIS — M1712 Unilateral primary osteoarthritis, left knee: Secondary | ICD-10-CM | POA: Diagnosis not present

## 2018-08-06 DIAGNOSIS — Z96651 Presence of right artificial knee joint: Secondary | ICD-10-CM | POA: Diagnosis not present

## 2018-09-03 DIAGNOSIS — Z79899 Other long term (current) drug therapy: Secondary | ICD-10-CM | POA: Diagnosis not present

## 2018-09-10 DIAGNOSIS — Z96652 Presence of left artificial knee joint: Secondary | ICD-10-CM | POA: Diagnosis not present

## 2018-09-10 DIAGNOSIS — Z471 Aftercare following joint replacement surgery: Secondary | ICD-10-CM | POA: Diagnosis not present

## 2019-05-08 DIAGNOSIS — Z23 Encounter for immunization: Secondary | ICD-10-CM | POA: Diagnosis not present

## 2019-05-19 DIAGNOSIS — Z20828 Contact with and (suspected) exposure to other viral communicable diseases: Secondary | ICD-10-CM | POA: Diagnosis not present

## 2019-06-12 DIAGNOSIS — L578 Other skin changes due to chronic exposure to nonionizing radiation: Secondary | ICD-10-CM | POA: Diagnosis not present

## 2019-06-12 DIAGNOSIS — L570 Actinic keratosis: Secondary | ICD-10-CM | POA: Diagnosis not present

## 2019-06-12 DIAGNOSIS — D225 Melanocytic nevi of trunk: Secondary | ICD-10-CM | POA: Diagnosis not present

## 2019-06-12 DIAGNOSIS — L821 Other seborrheic keratosis: Secondary | ICD-10-CM | POA: Diagnosis not present

## 2019-08-14 DIAGNOSIS — R82998 Other abnormal findings in urine: Secondary | ICD-10-CM | POA: Diagnosis not present

## 2019-08-14 DIAGNOSIS — E291 Testicular hypofunction: Secondary | ICD-10-CM | POA: Diagnosis not present

## 2019-08-14 DIAGNOSIS — R7301 Impaired fasting glucose: Secondary | ICD-10-CM | POA: Diagnosis not present

## 2019-08-14 DIAGNOSIS — C61 Malignant neoplasm of prostate: Secondary | ICD-10-CM | POA: Diagnosis not present

## 2019-08-14 DIAGNOSIS — I10 Essential (primary) hypertension: Secondary | ICD-10-CM | POA: Diagnosis not present

## 2019-08-14 DIAGNOSIS — E7849 Other hyperlipidemia: Secondary | ICD-10-CM | POA: Diagnosis not present

## 2019-08-22 DIAGNOSIS — C61 Malignant neoplasm of prostate: Secondary | ICD-10-CM | POA: Diagnosis not present

## 2019-08-22 DIAGNOSIS — Z0000 Encounter for general adult medical examination without abnormal findings: Secondary | ICD-10-CM | POA: Diagnosis not present

## 2019-08-22 DIAGNOSIS — Z1331 Encounter for screening for depression: Secondary | ICD-10-CM | POA: Diagnosis not present

## 2019-08-22 DIAGNOSIS — E785 Hyperlipidemia, unspecified: Secondary | ICD-10-CM | POA: Diagnosis not present

## 2019-08-22 DIAGNOSIS — R7301 Impaired fasting glucose: Secondary | ICD-10-CM | POA: Diagnosis not present

## 2019-08-22 DIAGNOSIS — I10 Essential (primary) hypertension: Secondary | ICD-10-CM | POA: Diagnosis not present

## 2019-08-28 DIAGNOSIS — Z1212 Encounter for screening for malignant neoplasm of rectum: Secondary | ICD-10-CM | POA: Diagnosis not present

## 2019-08-30 ENCOUNTER — Other Ambulatory Visit: Payer: Self-pay | Admitting: Endocrinology

## 2019-08-30 DIAGNOSIS — R7301 Impaired fasting glucose: Secondary | ICD-10-CM

## 2019-10-02 ENCOUNTER — Ambulatory Visit
Admission: RE | Admit: 2019-10-02 | Discharge: 2019-10-02 | Disposition: A | Discharge status: Home / Self Care | Payer: No Typology Code available for payment source | Source: Ambulatory Visit | Attending: Endocrinology | Admitting: Endocrinology

## 2019-10-02 DIAGNOSIS — R7301 Impaired fasting glucose: Secondary | ICD-10-CM

## 2019-10-02 DIAGNOSIS — E785 Hyperlipidemia, unspecified: Secondary | ICD-10-CM | POA: Diagnosis not present

## 2019-10-02 DIAGNOSIS — I700 Atherosclerosis of aorta: Secondary | ICD-10-CM | POA: Diagnosis not present

## 2019-11-07 ENCOUNTER — Other Ambulatory Visit: Payer: Self-pay

## 2019-11-07 ENCOUNTER — Other Ambulatory Visit (HOSPITAL_COMMUNITY)
Admission: RE | Admit: 2019-11-07 | Discharge: 2019-11-07 | Disposition: A | Discharge status: Home / Self Care | Payer: BC Managed Care – PPO | Source: Ambulatory Visit | Attending: Interventional Cardiology | Admitting: Interventional Cardiology

## 2019-11-07 ENCOUNTER — Encounter: Payer: Self-pay | Admitting: Cardiology

## 2019-11-07 ENCOUNTER — Ambulatory Visit (INDEPENDENT_AMBULATORY_CARE_PROVIDER_SITE_OTHER): Payer: BC Managed Care – PPO | Admitting: Cardiology

## 2019-11-07 VITALS — BP 126/86 | HR 63 | Ht 74.0 in | Wt 258.0 lb

## 2019-11-07 DIAGNOSIS — I2510 Atherosclerotic heart disease of native coronary artery without angina pectoris: Secondary | ICD-10-CM | POA: Diagnosis not present

## 2019-11-07 DIAGNOSIS — E7800 Pure hypercholesterolemia, unspecified: Secondary | ICD-10-CM | POA: Diagnosis not present

## 2019-11-07 DIAGNOSIS — I700 Atherosclerosis of aorta: Secondary | ICD-10-CM

## 2019-11-07 DIAGNOSIS — R079 Chest pain, unspecified: Secondary | ICD-10-CM | POA: Diagnosis not present

## 2019-11-07 DIAGNOSIS — Z79899 Other long term (current) drug therapy: Secondary | ICD-10-CM

## 2019-11-07 DIAGNOSIS — Z20822 Contact with and (suspected) exposure to covid-19: Secondary | ICD-10-CM | POA: Insufficient documentation

## 2019-11-07 DIAGNOSIS — Z01812 Encounter for preprocedural laboratory examination: Secondary | ICD-10-CM | POA: Diagnosis not present

## 2019-11-07 LAB — CBC
Hematocrit: 45.4 % (ref 37.5–51.0)
Hemoglobin: 15.4 g/dL (ref 13.0–17.7)
MCH: 28.4 pg (ref 26.6–33.0)
MCHC: 33.9 g/dL (ref 31.5–35.7)
MCV: 84 fL (ref 79–97)
Platelets: 272 10*3/uL (ref 150–450)
RBC: 5.43 x10E6/uL (ref 4.14–5.80)
RDW: 14.6 % (ref 11.6–15.4)
WBC: 6.4 10*3/uL (ref 3.4–10.8)

## 2019-11-07 LAB — BASIC METABOLIC PANEL
BUN/Creatinine Ratio: 12 (ref 10–24)
BUN: 13 mg/dL (ref 8–27)
CO2: 25 mmol/L (ref 20–29)
Calcium: 9.8 mg/dL (ref 8.6–10.2)
Chloride: 103 mmol/L (ref 96–106)
Creatinine, Ser: 1.08 mg/dL (ref 0.76–1.27)
GFR calc Af Amer: 85 mL/min/{1.73_m2} (ref >59)
GFR calc non Af Amer: 74 mL/min/{1.73_m2} (ref >59)
Glucose: 113 mg/dL — ABNORMAL HIGH (ref 65–99)
Potassium: 4.4 mmol/L (ref 3.5–5.2)
Sodium: 135 mmol/L (ref 134–144)

## 2019-11-07 LAB — SARS CORONAVIRUS 2 (TAT 6-24 HRS): SARS Coronavirus 2: NEGATIVE

## 2019-11-07 MED ORDER — ATORVASTATIN CALCIUM 40 MG PO TABS: 40.0000 mg | ORAL_TABLET | Freq: Every day | ORAL | 3 refills | Status: DC

## 2019-11-07 MED ORDER — SODIUM CHLORIDE 0.9% FLUSH: 3.0000 mL | Freq: Two times a day (BID) | INTRAVENOUS | Status: DC

## 2019-11-07 NOTE — Progress Notes (Signed)
Cardiology Office Note:    Date:  11/07/2019   ID:  [name], DOB 1955-09-09, MRN [id number]  PCP:  Reynold Bowen, MD  Cardiologist:  Candee Furbish, MD  Electrophysiologist:  None   Referring MD: Reynold Bowen, MD     History of Present Illness:    Austin Moses is a 64 y.o. male here for the evaluation of elevated coronary calcium score at the request of Dr. Carrolyn Meiers.  Coronary calcium score on 10/02/2019: 1. Patient's total coronary artery calcium score is 1,958 which is 99th percentile for patient's of matched age, gender and race/ethnicity. Please note that although the presence of coronary artery calcium documents the presence of coronary artery disease, the severity of this disease and any potential stenosis cannot be assessed on this noncontrast CT examination. Assessment for potential risk factor modification, dietary therapy or pharmacologic therapy may be warranted, if clinically indicated. 2.  Aortic Atherosclerosis (ICD10-I70.0).   Non-smoker has recently been trying to lose weight cutting out alcohol wheat and sugar.  Feels better.  Has been on atorvastatin 20 mg for hyperlipidemia.  He has had occasional chest discomfort.  He does describe some left sided fairly localized chest discomfort, questions whether or not this is from doing push-ups previously.  He also occasionally will have back pain as well.  He sometimes worries about this because his father had back pain prior to his heart attack that killed him.  His father had coronary artery disease died at age 40.  He played football at [employer], ran track.  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  Also had a history of robotic prostatectomy.  Prostate cancer.  Creatinine 1.1 potassium 4.6 white count 6.6 hemoglobin 16.9 platelets 273 total cholesterol 207 triglycerides 161 LDL 145 HDL 30  Past Medical History:  Diagnosis Date  .  Arthritis   . Cancer [facility])    prostate cancer  . Cancer of prostate ([place])   . Cataract   . Hyperlipidemia   . Hypertension   . Impaired fasting blood sugar   . Joint pain   . Low testosterone in male   . Obesity   . Protrusion of lumbar intervertebral disc    L3-L4, L5-S1  . Vitamin D deficiency     Past Surgical History:  Procedure Laterality Date  . APPENDECTOMY    . COLONOSCOPY  12/14/2010   Normal  . cotisone injection  05/31/2016   every 3-4 months in left knee  . EYE SURGERY     bilateral cataract surgery with lens implants  . KNEE ARTHROSCOPY     dr supple- left knee  . PROSTATE BIOPSY    . ROBOT ASSISTED LAPAROSCOPIC RADICAL PROSTATECTOMY N/A 08/07/2016   Procedure: ROBOTIC ASSISTED LAPAROSCOPIC RADICAL PROSTATECTOMY LEVEL 1;  Surgeon: Raynelle Bring, MD;  Location: [facility] ORS;  Service: Urology;  Laterality: N/A;    Current Medications: Current Meds  Medication Sig  . amLODipine-benazepril (LOTREL) 5-10 MG per capsule take 1 capsule by mouth once daily  . aspirin EC 81 MG tablet Take 81 mg by mouth daily.  . diclofenac Sodium (VOLTAREN) 1 % GEL Apply topically 4 (four) times daily.  Marland Kitchen HYDROcodone-acetaminophen (NORCO) 5-325 MG tablet Take 1-2 tablets by mouth every 6 (six) hours as needed.  . Vitamin D, Ergocalciferol, (DRISDOL) 1.25 MG (50000 UNIT) CAPS capsule Take 50,000 Units by mouth every 7 (seven) days.  . [DISCONTINUED] atorvastatin (LIPITOR) 20 MG tablet Take  1 tablet by mouth daily.   Current Facility-Administered Medications for the 11/07/19 encounter (Office Visit) with Jerline Pain, MD  Medication  . sodium chloride flush (NS) 0.9 % injection 3 mL     Allergies:   Sulfa antibiotics   Social History   Socioeconomic History  . Marital status: Married    Spouse name: Not on file  . Number of children: Not on file  . Years of education: Not on file  . Highest education level: Not on file  Occupational History  . Not on file  Tobacco Use  . Smoking  status: Never Smoker  . Smokeless tobacco: Never Used  Substance and Sexual Activity  . Alcohol use: Yes    Alcohol/week: 5.0 standard drinks    Types: 5 Cans of beer per week  . Drug use: No  . Sexual activity: Yes  Other Topics Concern  . Not on file  Social History Narrative  . Not on file   Social Determinants of Health   Financial Resource Strain:   . Difficulty of Paying Living Expenses:   Food Insecurity:   . Worried About Charity fundraiser in the Last Year:   . Arboriculturist in the Last Year:   Transportation Needs:   . Film/video editor (Medical):   Marland Kitchen Lack of Transportation (Non-Medical):   Physical Activity:   . Days of Exercise per Week:   . Minutes of Exercise per Session:   Stress:   . Feeling of Stress :   Social Connections:   . Frequency of Communication with Friends and Family:   . Frequency of Social Gatherings with Friends and Family:   . Attends Religious Services:   . Active Member of Clubs or Organizations:   . Attends Archivist Meetings:   Marland Kitchen Marital Status:      Family History: The patient's family history includes Arthritis in his maternal grandfather; Breast cancer in his mother; Colon polyps in his father; Dementia in his mother; Depression in his sister; Heart disease in his father; Hyperlipidemia in his father, mother, and sister; Hypertension in his father, mother, and sister.  ROS:   Please see the history of present illness.    Denies any fevers chills nausea vomiting syncope bleeding orthopnea PND all other systems reviewed and are negative.  EKGs/Labs/Other Studies Reviewed:    The following studies were reviewed today: Prior office notes from Dr. Forde Dandy lab work reviewed.  EKG:  EKG is  ordered today.  The ekg ordered today demonstrates sinus rhythm 63 with no other abnormalities.  Recent Labs: No results found for requested labs within last 8760 hours.  Recent Lipid Panel    Component Value Date/Time   CHOL  186 09/16/2012 0858   TRIG 165.0 (H) 09/16/2012 0858   HDL 50.50 09/16/2012 0858   CHOLHDL 4 09/16/2012 0858   VLDL 33.0 09/16/2012 0858   LDLCALC 103 (H) 09/16/2012 0858    Physical Exam:    VS:  BP 126/86   Pulse 63   Ht 6\' 2"  (1.88 m)   Wt 258 lb (117 kg)   SpO2 97%   BMI 33.13 kg/m     Wt Readings from Last 3 Encounters:  11/07/19 258 lb (117 kg)  08/07/16 282 lb (127.9 kg)  07/25/16 282 lb 12.8 oz (128.3 kg)     GEN:  Well nourished, well developed in no acute distress HEENT: Normal NECK: No JVD; No carotid bruits LYMPHATICS: No lymphadenopathy CARDIAC:  RRR, no murmurs, rubs, gallops RESPIRATORY:  Clear to auscultation without rales, wheezing or rhonchi  ABDOMEN: Soft, non-tender, non-distended MUSCULOSKELETAL:  No edema; No deformity  SKIN: Warm and dry NEUROLOGIC:  Alert and oriented x 3 PSYCHIATRIC:  Normal affect   ASSESSMENT:    1. Chest pain of uncertain etiology   2. Coronary artery disease involving native coronary artery of native heart without angina pectoris   3. Aortic atherosclerosis ([place])   4. Pure hypercholesterolemia   5. Pre-procedure lab exam   6. Medication management    PLAN:    In order of problems listed above:  Coronary artery disease/coronary artery calcification/atherosclerosis -Given his calcium score of close to 2000, dense multivessel coronary calcification and chest discomfort albeit somewhat atypical, I will proceed with cardiac catheterization.  Risks and benefits of been explained including stroke heart attack death renal impairment bleeding.  He is willing to proceed. -Continue with aspirin 81 mg. -We will increase his atorvastatin from 20 up to 40 mg.  Our goal LDL will be 70.  Obviously we can increase this to 80 in the future if necessary. -Repeat lipid panel in 2 months with ALT in 2 months.  We will see him back then or sooner if needed.  Aortic atherosclerosis -Intensifying statin therapy.  Given his dense coronary  calcification and concomitant aortic atherosclerosis, we will go ahead and check a carotid Doppler to ensure that he does not have any significant disease.  No prior stroke history.  Hyperlipidemia -LDL is above.  Goal 70.  Increasing atorvastatin.  May need additional Zetia to obtain goal or PCSK9 if necessary.  Family history of CAD -Father died suddenly in his late 7s from MI.  Risk factor prevention.     Medication Adjustments/Labs and Tests Ordered: Current medicines are reviewed at length with the patient today.  Concerns regarding medicines are outlined above.  Orders Placed This Encounter  Procedures  . ALT  . Lipid panel  . CBC  . Basic metabolic panel  . EKG 12-Lead  . ECHOCARDIOGRAM COMPLETE  . VAS US CAROTID   Meds ordered this encounter  Medications  . atorvastatin (LIPITOR) 40 MG tablet    Sig: Take 1 tablet (40 mg total) by mouth daily.    Dispense:  90 tablet    Refill:  3  . sodium chloride flush (NS) 0.9 % injection 3 mL    Patient Instructions  Medication Instructions:  Please increase Atorvastatin to 40 mg a day. Continue all other medications as listed.  *If you need a refill on your cardiac medications before your next appointment, please call your pharmacy*  Lab Work: Please have blood work today (CBC, BMP) And in 2 months (Lipid, ALT)  Have COVID screen as scheduled prior to your heart cath.  If you have labs (blood work) drawn today and your tests are completely normal, you will receive your results only by: Marland Kitchen MyChart Message (if you have MyChart) OR . A paper copy in the mail If you have any lab test that is abnormal or we need to change your treatment, we will call you to review the results.   Testing/Procedures: Your physician has requested that you have a cardiac catheterization. Cardiac catheterization is used to diagnose and/or treat various heart conditions. Doctors may recommend this procedure for a number of different reasons. The  most common reason is to evaluate chest pain. Chest pain can be a symptom of coronary artery disease (CAD), and cardiac catheterization can show whether  plaque is narrowing or blocking your heart's arteries. This procedure is also used to evaluate the valves, as well as measure the blood flow and oxygen levels in different parts of your heart. For further information please visit HugeFiesta.tn. Please follow instruction sheet, as given.  Your physician has requested that you have an echocardiogram. Echocardiography is a painless test that uses sound waves to create images of your heart. It provides your doctor with information about the size and shape of your heart and how well your heart's chambers and valves are working. This procedure takes approximately one hour. There are no restrictions for this procedure.  This test will be completed at our [address] office.  Your physician has requested that you have a carotid duplex. This test is an ultrasound of the carotid arteries in your neck. It looks at blood flow through these arteries that supply the brain with blood. Allow one hour for this exam. There are no restrictions or special instructions.  This test will be completed at our [facility] office.  Follow-Up: At [facility], you and your health needs are our priority.  As part of our continuing mission to provide you with exceptional heart care, we have created designated Provider Care Teams.  These Care Teams include your primary Cardiologist (physician) and Advanced Practice Providers (APPs -  Physician Assistants and Nurse Practitioners) who all work together to provide you with the care you need, when you need it.  We recommend signing up for the patient portal called "MyChart".  Sign up information is provided on this After Visit Summary.  MyChart is used to connect with patients for Virtual Visits (Telemedicine).  Patients are able to view lab/test results, encounter notes,  upcoming appointments, etc.  Non-urgent messages can be sent to your provider as well.   To learn more about what you can do with MyChart, go to [email or web address].    Your next appointment:   2 month(s)  The format for your next appointment:   In Person  Provider:   Candee Furbish, MD   Thank you for choosing [facility]!!       [place] OFFICE [place], [place] [address] Dept: [phone] Loc: [place]  11/07/2019  You are scheduled for a cardiac cath on Monday November 10, 2019 with Dr. Tamala Julian.  1. Please arrive at the [facility] (Main Entrance A) at [facility]: [address] [place], [place] [address] at 5:30 am (two hours before your procedure to ensure your preparation). Free valet parking service is available.   Special note: Every effort is made to have your procedure done on time. Please understand that emergencies sometimes delay scheduled procedures.  2. Diet: Nothing after midnight except medications with water.  3. Labs: Please have blood work today and COVID screen today at the [facility] [facility] testing site.  4. Medication instructions in preparation for your procedure:  On the morning of your procedure, take your Aspirin and any morning medicines NOT listed above.  You may use sips of water.  5. Plan for one night stay--bring personal belongings. 6. Bring a current list of your medications and current insurance cards. 7. You MUST have a responsible person to drive you home. 8. Someone MUST be with you the first 24 hours after you arrive home or your discharge will be delayed. 9. Please wear clothes that are easy to  get on and off and wear slip-on shoes.  Thank you for allowing [place] to care for you!   -- [facility] Invasive Cardiovascular services     Signed, Candee Furbish, MD  11/07/2019 10:40 AM    [place]

## 2019-11-07 NOTE — Patient Instructions (Addendum)
Medication Instructions:  Please increase Atorvastatin to 40 mg a day. Continue all other medications as listed.  *If you need a refill on your cardiac medications before your next appointment, please call your pharmacy*  Lab Work: Please have blood work today (CBC, BMP) And in 2 months (Lipid, ALT)  Have COVID screen as scheduled prior to your heart cath.  If you have labs (blood work) drawn today and your tests are completely normal, you will receive your results only by: Marland Kitchen MyChart Message (if you have MyChart) OR . A paper copy in the mail If you have any lab test that is abnormal or we need to change your treatment, we will call you to review the results.   Testing/Procedures: Your physician has requested that you have a cardiac catheterization. Cardiac catheterization is used to diagnose and/or treat various heart conditions. Doctors may recommend this procedure for a number of different reasons. The most common reason is to evaluate chest pain. Chest pain can be a symptom of coronary artery disease (CAD), and cardiac catheterization can show whether plaque is narrowing or blocking your heart's arteries. This procedure is also used to evaluate the valves, as well as measure the blood flow and oxygen levels in different parts of your heart. For further information please visit HugeFiesta.tn. Please follow instruction sheet, as given.  Your physician has requested that you have an echocardiogram. Echocardiography is a painless test that uses sound waves to create images of your heart. It provides your doctor with information about the size and shape of your heart and how well your heart's chambers and valves are working. This procedure takes approximately one hour. There are no restrictions for this procedure.  This test will be completed at our [address] office.  Your physician has requested that you have a carotid duplex. This test is an ultrasound of the carotid arteries  in your neck. It looks at blood flow through these arteries that supply the brain with blood. Allow one hour for this exam. There are no restrictions or special instructions.  This test will be completed at our [facility] office.  Follow-Up: At [facility], you and your health needs are our priority.  As part of our continuing mission to provide you with exceptional heart care, we have created designated Provider Care Teams.  These Care Teams include your primary Cardiologist (physician) and Advanced Practice Providers (APPs -  Physician Assistants and Nurse Practitioners) who all work together to provide you with the care you need, when you need it.  We recommend signing up for the patient portal called "MyChart".  Sign up information is provided on this After Visit Summary.  MyChart is used to connect with patients for Virtual Visits (Telemedicine).  Patients are able to view lab/test results, encounter notes, upcoming appointments, etc.  Non-urgent messages can be sent to your provider as well.   To learn more about what you can do with MyChart, go to [email or web address].    Your next appointment:   2 month(s)  The format for your next appointment:   In Person  Provider:   Candee Furbish, MD   Thank you for choosing [facility]!!       [place] OFFICE [place], [place] [address] Dept: [phone] Loc: [place]  11/07/2019  You are scheduled for a cardiac cath on Monday November 10, 2019 with Dr. Tamala Julian.  1. Please arrive at the [facility] (Main Entrance A) at [facility]: [address] [place], [place] [address] at 5:30 am (two hours before your procedure to ensure your preparation). Free valet parking service is available.   Special note: Every effort is made to have your procedure done on time. Please understand that emergencies  sometimes delay scheduled procedures.  2. Diet: Nothing after midnight except medications with water.  3. Labs: Please have blood work today and COVID screen today at the [employer] [facility] testing site.  4. Medication instructions in preparation for your procedure:  On the morning of your procedure, take your Aspirin and any morning medicines NOT listed above.  You may use sips of water.  5. Plan for one night stay--bring personal belongings. 6. Bring a current list of your medications and current insurance cards. 7. You MUST have a responsible person to drive you home. 8. Someone MUST be with you the first 24 hours after you arrive home or your discharge will be delayed. 9. Please wear clothes that are easy to get on and off and wear slip-on shoes.  Thank you for allowing [place] to care for you!   -- [place] Invasive Cardiovascular services

## 2019-11-09 NOTE — H&P (Signed)
CA Calcium > 1900 Atypical symptoms. Cath is first ischemic eval

## 2019-11-10 ENCOUNTER — Other Ambulatory Visit: Payer: Self-pay

## 2019-11-10 ENCOUNTER — Ambulatory Visit (HOSPITAL_COMMUNITY)
Admission: RE | Admit: 2019-11-10 | Discharge: 2019-11-10 | Disposition: A | Discharge status: Home / Self Care | Payer: BC Managed Care – PPO | Attending: Interventional Cardiology | Admitting: Interventional Cardiology

## 2019-11-10 ENCOUNTER — Encounter (HOSPITAL_COMMUNITY)
Admission: RE | Disposition: A | Discharge status: Home / Self Care | Payer: BC Managed Care – PPO | Source: Home / Self Care | Attending: Interventional Cardiology

## 2019-11-10 DIAGNOSIS — I2584 Coronary atherosclerosis due to calcified coronary lesion: Secondary | ICD-10-CM | POA: Diagnosis not present

## 2019-11-10 DIAGNOSIS — E7800 Pure hypercholesterolemia, unspecified: Secondary | ICD-10-CM | POA: Diagnosis present

## 2019-11-10 DIAGNOSIS — Z79899 Other long term (current) drug therapy: Secondary | ICD-10-CM | POA: Diagnosis not present

## 2019-11-10 DIAGNOSIS — Z6833 Body mass index (BMI) 33.0-33.9, adult: Secondary | ICD-10-CM | POA: Insufficient documentation

## 2019-11-10 DIAGNOSIS — R0789 Other chest pain: Secondary | ICD-10-CM

## 2019-11-10 DIAGNOSIS — I2510 Atherosclerotic heart disease of native coronary artery without angina pectoris: Secondary | ICD-10-CM | POA: Diagnosis not present

## 2019-11-10 DIAGNOSIS — Z882 Allergy status to sulfonamides status: Secondary | ICD-10-CM | POA: Insufficient documentation

## 2019-11-10 DIAGNOSIS — Z8261 Family history of arthritis: Secondary | ICD-10-CM | POA: Insufficient documentation

## 2019-11-10 DIAGNOSIS — E291 Testicular hypofunction: Secondary | ICD-10-CM | POA: Diagnosis present

## 2019-11-10 DIAGNOSIS — M1990 Unspecified osteoarthritis, unspecified site: Secondary | ICD-10-CM | POA: Diagnosis not present

## 2019-11-10 DIAGNOSIS — Z7982 Long term (current) use of aspirin: Secondary | ICD-10-CM | POA: Diagnosis not present

## 2019-11-10 DIAGNOSIS — Z8249 Family history of ischemic heart disease and other diseases of the circulatory system: Secondary | ICD-10-CM | POA: Diagnosis not present

## 2019-11-10 DIAGNOSIS — E669 Obesity, unspecified: Secondary | ICD-10-CM | POA: Insufficient documentation

## 2019-11-10 DIAGNOSIS — I700 Atherosclerosis of aorta: Secondary | ICD-10-CM | POA: Diagnosis present

## 2019-11-10 DIAGNOSIS — I10 Essential (primary) hypertension: Secondary | ICD-10-CM | POA: Diagnosis not present

## 2019-11-10 DIAGNOSIS — Z9079 Acquired absence of other genital organ(s): Secondary | ICD-10-CM | POA: Insufficient documentation

## 2019-11-10 DIAGNOSIS — R079 Chest pain, unspecified: Secondary | ICD-10-CM

## 2019-11-10 DIAGNOSIS — E785 Hyperlipidemia, unspecified: Secondary | ICD-10-CM | POA: Diagnosis not present

## 2019-11-10 HISTORY — PX: LEFT HEART CATH AND CORONARY ANGIOGRAPHY: CATH118249

## 2019-11-10 SURGERY — LEFT HEART CATH AND CORONARY ANGIOGRAPHY
Anesthesia: LOCAL

## 2019-11-10 MED ORDER — HEPARIN SODIUM (PORCINE) 1000 UNIT/ML IJ SOLN
INTRAMUSCULAR | Status: DC | PRN
  Administered 2019-11-10: 5500 [IU] via INTRAVENOUS

## 2019-11-10 MED ORDER — SODIUM CHLORIDE 0.9 % IV SOLN: 250.0000 mL | INTRAVENOUS | Status: DC | PRN

## 2019-11-10 MED ORDER — SODIUM CHLORIDE 0.9% FLUSH: 3.0000 mL | INTRAVENOUS | Status: DC | PRN

## 2019-11-10 MED ORDER — VERAPAMIL HCL 2.5 MG/ML IV SOLN
INTRAVENOUS | Status: DC | PRN
  Administered 2019-11-10: 10 mL via INTRA_ARTERIAL

## 2019-11-10 MED ORDER — SODIUM CHLORIDE 0.9% FLUSH: 3.0000 mL | Freq: Two times a day (BID) | INTRAVENOUS | Status: DC

## 2019-11-10 MED ORDER — LIDOCAINE HCL (PF) 1 % IJ SOLN
INTRAMUSCULAR | Status: DC | PRN
  Administered 2019-11-10: 2 mL

## 2019-11-10 MED ORDER — FENTANYL CITRATE (PF) 100 MCG/2ML IJ SOLN
INTRAMUSCULAR | Status: AC
  Filled 2019-11-10: qty 2

## 2019-11-10 MED ORDER — IOHEXOL 350 MG/ML SOLN
INTRAVENOUS | Status: DC | PRN
  Administered 2019-11-10: 11:00:00 80 mL

## 2019-11-10 MED ORDER — HYDRALAZINE HCL 20 MG/ML IJ SOLN: 10.0000 mg | INTRAMUSCULAR | Status: DC | PRN

## 2019-11-10 MED ORDER — LIDOCAINE HCL (PF) 1 % IJ SOLN
INTRAMUSCULAR | Status: AC
  Filled 2019-11-10: qty 30

## 2019-11-10 MED ORDER — SODIUM CHLORIDE 0.9 % IV SOLN: INTRAVENOUS | Status: DC

## 2019-11-10 MED ORDER — HEPARIN (PORCINE) IN NACL 1000-0.9 UT/500ML-% IV SOLN
INTRAVENOUS | Status: DC | PRN
  Administered 2019-11-10 (×2): 500 mL

## 2019-11-10 MED ORDER — MIDAZOLAM HCL 2 MG/2ML IJ SOLN
INTRAMUSCULAR | Status: AC
  Filled 2019-11-10: qty 2

## 2019-11-10 MED ORDER — MIDAZOLAM HCL 2 MG/2ML IJ SOLN
INTRAMUSCULAR | Status: DC | PRN
  Administered 2019-11-10 (×2): 1 mg via INTRAVENOUS

## 2019-11-10 MED ORDER — LABETALOL HCL 5 MG/ML IV SOLN: 10.0000 mg | INTRAVENOUS | Status: DC | PRN

## 2019-11-10 MED ORDER — HEPARIN (PORCINE) IN NACL 1000-0.9 UT/500ML-% IV SOLN
INTRAVENOUS | Status: AC
  Filled 2019-11-10: qty 1000

## 2019-11-10 MED ORDER — ASPIRIN 81 MG PO CHEW: 81.0000 mg | CHEWABLE_TABLET | ORAL | Status: DC

## 2019-11-10 MED ORDER — FENTANYL CITRATE (PF) 100 MCG/2ML IJ SOLN
INTRAMUSCULAR | Status: DC | PRN
  Administered 2019-11-10: 25 ug via INTRAVENOUS

## 2019-11-10 MED ORDER — SODIUM CHLORIDE 0.9 % WEIGHT BASED INFUSION
3.0000 mL/kg/h | INTRAVENOUS | Status: DC
  Administered 2019-11-10: 3 mL/kg/h via INTRAVENOUS

## 2019-11-10 MED ORDER — ONDANSETRON HCL 4 MG/2ML IJ SOLN: 4.0000 mg | Freq: Four times a day (QID) | INTRAMUSCULAR | Status: DC | PRN

## 2019-11-10 MED ORDER — SODIUM CHLORIDE 0.9 % WEIGHT BASED INFUSION: 1.0000 mL/kg/h | INTRAVENOUS | Status: DC

## 2019-11-10 MED ORDER — HEPARIN SODIUM (PORCINE) 1000 UNIT/ML IJ SOLN
INTRAMUSCULAR | Status: AC
  Filled 2019-11-10: qty 1

## 2019-11-10 MED ORDER — ACETAMINOPHEN 325 MG PO TABS: 650.0000 mg | ORAL_TABLET | ORAL | Status: DC | PRN

## 2019-11-10 MED ORDER — VERAPAMIL HCL 2.5 MG/ML IV SOLN
INTRAVENOUS | Status: AC
  Filled 2019-11-10: qty 2

## 2019-11-10 SURGICAL SUPPLY — 10 items
CATH 5FR JL3.5 JR4 ANG PIG MP (CATHETERS) ×1 IMPLANT
DEVICE RAD COMP TR BAND LRG (VASCULAR PRODUCTS) ×1 IMPLANT
GLIDESHEATH SLEND A-KIT 6F 22G (SHEATH) ×1 IMPLANT
GUIDEWIRE INQWIRE 1.5J.035X260 (WIRE) IMPLANT
INQWIRE 1.5J .035X260CM (WIRE) ×2
KIT HEART LEFT (KITS) ×2 IMPLANT
PACK CARDIAC CATHETERIZATION (CUSTOM PROCEDURE TRAY) ×2 IMPLANT
SHEATH PROBE COVER 6X72 (BAG) ×1 IMPLANT
TRANSDUCER W/STOPCOCK (MISCELLANEOUS) ×2 IMPLANT
TUBING CIL FLEX 10 FLL-RA (TUBING) ×2 IMPLANT

## 2019-11-10 NOTE — CV Procedure (Signed)
   Left heart cath, coronary angiography, and left ventriculography.  Right radial access obtained by real-time vascular ultrasound guidance.  Widely patent left main  Diffuse luminal irregularities in LAD but the vessel is widely patent  Widely patent circumflex with minimal luminal irregularities in the mid to distal vessel.  Diffuse luminal irregularities in the dominant right coronary.  No focal obstruction noted.  Overall normal LV function.  EF 55%.  Normal LVEDP.  Home later today.

## 2019-11-10 NOTE — Discharge Instructions (Signed)
Drink plenty of fluids for 48 hours and keep wrist elevated at heart level for 24 hours  Radial Site Care   This sheet gives you information about how to care for yourself after your procedure. Your health care provider may also give you more specific instructions. If you have problems or questions, contact your health care provider. What can I expect after the procedure? After the procedure, it is common to have:  Bruising and tenderness at the catheter insertion area. Follow these instructions at home: Medicines  Take over-the-counter and prescription medicines only as told by your health care provider. Insertion site care 1. Follow instructions from your health care provider about how to take care of your insertion site. Make sure you: ? Wash your hands with soap and water before you change your bandage (dressing). If soap and water are not available, use hand sanitizer. ? Remove your dressing as told by your health care provider. In 24 hours 2. Check your insertion site every day for signs of infection. Check for: ? Redness, swelling, or pain. ? Fluid or blood. ? Pus or a bad smell. ? Warmth. 3. Do not take baths, swim, or use a hot tub until your health care provider approves. 4. You may shower 24-48 hours after the procedure, or as directed by your health care provider. ? Remove the dressing and gently wash the site with plain soap and water. ? Pat the area dry with a clean towel. ? Do not rub the site. That could cause bleeding. 5. Do not apply powder or lotion to the site. Activity   1. For 24 hours after the procedure, or as directed by your health care provider: ? Do not flex or bend the affected arm. ? Do not push or pull heavy objects with the affected arm. ? Do not drive yourself home from the hospital or clinic. You may drive 24 hours after the procedure unless your health care provider tells you not to. ? Do not operate machinery or power tools. 2. Do not lift  anything that is heavier than 10 lb (4.5 kg), or the limit that you are told, until your health care provider says that it is safe.  For 4 days 3. Ask your health care provider when it is okay to: ? Return to work or school. ? Resume usual physical activities or sports. ? Resume sexual activity. General instructions  If the catheter site starts to bleed, raise your arm and put firm pressure on the site. If the bleeding does not stop, get help right away. This is a medical emergency.  If you went home on the same day as your procedure, a responsible adult should be with you for the first 24 hours after you arrive home.  Keep all follow-up visits as told by your health care provider. This is important. Contact a health care provider if:  You have a fever.  You have redness, swelling, or yellow drainage around your insertion site. Get help right away if:  You have unusual pain at the radial site.  The catheter insertion area swells very fast.  The insertion area is bleeding, and the bleeding does not stop when you hold steady pressure on the area.  Your arm or hand becomes pale, cool, tingly, or numb. These symptoms may represent a serious problem that is an emergency. Do not wait to see if the symptoms will go away. Get medical help right away. Call your local emergency services (911 in the [place] Do   not drive yourself to the hospital. Summary  After the procedure, it is common to have bruising and tenderness at the site.  Follow instructions from your health care provider about how to take care of your radial site wound. Check the wound every day for signs of infection.  Do not lift anything that is heavier than 10 lb (4.5 kg), or the limit that you are told, until your health care provider says that it is safe. This information is not intended to replace advice given to you by your health care provider. Make sure you discuss any questions you have with your health care  provider. Document Revised: 08/22/2017 Document Reviewed: 08/22/2017 Elsevier Patient Education  2020 [employer].  

## 2019-11-28 ENCOUNTER — Other Ambulatory Visit: Payer: Self-pay

## 2019-11-28 ENCOUNTER — Ambulatory Visit (HOSPITAL_COMMUNITY)
Admission: RE | Admit: 2019-11-28 | Discharge: 2019-11-28 | Disposition: A | Discharge status: Home / Self Care | Payer: BC Managed Care – PPO | Source: Ambulatory Visit | Attending: Cardiovascular Disease | Admitting: Cardiovascular Disease

## 2019-11-28 ENCOUNTER — Ambulatory Visit (HOSPITAL_BASED_OUTPATIENT_CLINIC_OR_DEPARTMENT_OTHER): Payer: BC Managed Care – PPO

## 2019-11-28 DIAGNOSIS — E7800 Pure hypercholesterolemia, unspecified: Secondary | ICD-10-CM | POA: Diagnosis not present

## 2019-11-28 DIAGNOSIS — I2510 Atherosclerotic heart disease of native coronary artery without angina pectoris: Secondary | ICD-10-CM | POA: Diagnosis not present

## 2019-11-28 DIAGNOSIS — R079 Chest pain, unspecified: Secondary | ICD-10-CM | POA: Diagnosis not present

## 2019-11-28 DIAGNOSIS — I700 Atherosclerosis of aorta: Secondary | ICD-10-CM | POA: Diagnosis not present

## 2019-11-28 MED ORDER — PERFLUTREN LIPID MICROSPHERE
1.0000 mL | INTRAVENOUS | Status: AC | PRN
  Administered 2019-11-28: 1 mL via INTRAVENOUS

## 2020-01-02 ENCOUNTER — Other Ambulatory Visit: Payer: BC Managed Care – PPO

## 2020-01-02 ENCOUNTER — Ambulatory Visit: Payer: BC Managed Care – PPO | Admitting: Cardiology

## 2020-01-23 ENCOUNTER — Other Ambulatory Visit: Payer: BC Managed Care – PPO

## 2020-02-13 ENCOUNTER — Encounter: Payer: Self-pay | Admitting: Cardiology

## 2020-02-13 ENCOUNTER — Other Ambulatory Visit: Payer: Self-pay

## 2020-02-13 ENCOUNTER — Ambulatory Visit (INDEPENDENT_AMBULATORY_CARE_PROVIDER_SITE_OTHER): Payer: BC Managed Care – PPO | Admitting: Cardiology

## 2020-02-13 ENCOUNTER — Other Ambulatory Visit: Payer: BC Managed Care – PPO | Admitting: *Deleted

## 2020-02-13 VITALS — BP 122/76 | HR 61 | Ht 74.0 in | Wt 249.8 lb

## 2020-02-13 DIAGNOSIS — E7800 Pure hypercholesterolemia, unspecified: Secondary | ICD-10-CM

## 2020-02-13 DIAGNOSIS — I700 Atherosclerosis of aorta: Secondary | ICD-10-CM | POA: Diagnosis not present

## 2020-02-13 DIAGNOSIS — Z79899 Other long term (current) drug therapy: Secondary | ICD-10-CM

## 2020-02-13 DIAGNOSIS — I2510 Atherosclerotic heart disease of native coronary artery without angina pectoris: Secondary | ICD-10-CM | POA: Diagnosis not present

## 2020-02-13 LAB — LIPID PANEL
Chol/HDL Ratio: 4.3 ratio (ref 0.0–5.0)
Cholesterol, Total: 158 mg/dL (ref 100–199)
HDL: 37 mg/dL — ABNORMAL LOW (ref >39)
LDL Chol Calc (NIH): 106 mg/dL — ABNORMAL HIGH (ref 0–99)
Triglycerides: 78 mg/dL (ref 0–149)
VLDL Cholesterol Cal: 15 mg/dL (ref 5–40)

## 2020-02-13 LAB — ALT: ALT: 22 IU/L (ref 0–44)

## 2020-02-13 NOTE — Patient Instructions (Signed)

## 2020-02-13 NOTE — Progress Notes (Signed)
Cardiology Office Note:    Date:  02/13/2020   ID:  SUEDE GREENAWALT, DOB 11/20/1955, MRN [id number]  PCP:  Reynold Bowen, MD  [facility] Cardiologist:  Candee Furbish, MD  [facility] Electrophysiologist:  None   Referring MD: Reynold Bowen, MD     History of Present Illness:    Austin Moses is a 64 y.o. male with coronary artery disease minimal luminal irregularities on heart catheterization normal pump function minimal carotid plaque on ultrasound here for follow-up.  Overall doing well.  Trying to alter diet.  No chest pain fevers chills nausea vomiting syncope bleeding.  No myalgias.  Cath 11/10/19:  Mild to moderate diffuse three-vessel atherosclerosis/luminal irregularity and calcification.  No obstructive disease is noted.  Widely patent left main  30% mid LAD.  LAD wraps around the left ventricular apex.  Codominant circumflex with no significant obstruction  Codominant right coronary with no significant obstruction.  Overall normal LV function.  EF 50 to 60%.  LVEDP is normal.  RECOMMENDATIONS:   Aggressive risk factor modification. Diagnostic Dominance: Co-dominant    ECHO 11/28/19:  1. LV global longitudinal strain is -20.7%. Left ventricular ejection  fraction, by estimation, is 60 to 65%. The left ventricle has normal  function. The left ventricle has no regional wall motion abnormalities.  The left ventricular internal cavity size  was mildly dilated. Left ventricular diastolic parameters were normal.  2. Right ventricular systolic function is normal. The right ventricular  size is normal. There is normal pulmonary artery systolic pressure.  3. The mitral valve is normal in structure. Mild mitral valve  regurgitation.  4. The aortic valve is abnormal. Aortic valve regurgitation is not  visualized. Mild aortic valve sclerosis is present, with no evidence of  aortic valve stenosis.   request of Dr. Carrolyn Meiers.  Coronary calcium  score on 10/02/2019: 1. Patient's total coronary artery calcium score is 1,958 which is 99th percentile for patient's of matched age, gender and race/ethnicity. Please note that although the presence of coronary artery calcium documents the presence of coronary artery disease, the severity of this disease and any potential stenosis cannot be assessed on this noncontrast CT examination. Assessment for potential risk factor modification, dietary therapy or pharmacologic therapy may be warranted, if clinically indicated. 2. Aortic Atherosclerosis (ICD10-I70.0).  Prior note: Non-smoker has recently been trying to lose weight cutting out alcohol wheat and sugar.  Feels better.  Has been on atorvastatin 20 mg for hyperlipidemia.  He has had occasional chest discomfort.  He does describe some left sided fairly localized chest discomfort, questions whether or not this is from doing push-ups previously.  He also occasionally will have back pain as well.  He sometimes worries about this because his father had back pain prior to his heart attack that killed him.  His father had coronary artery disease died at age 78.  He played football at [employer], ran track.  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  Also had a history of robotic prostatectomy.  Prostate cancer.  Creatinine 1.1 potassium 4.6 white count 6.6 hemoglobin 16.9 platelets 273 total cholesterol 207 triglycerides 161 LDL 145 HDL 30   Past Medical History:  Diagnosis Date  . Arthritis   . Cancer [facility])    prostate cancer  . Cancer of prostate ([place])   . Cataract   . Hyperlipidemia   . Hypertension   . Impaired fasting blood sugar   .  Joint pain   . Low testosterone in male   . Obesity   . Protrusion of lumbar intervertebral disc    L3-L4, L5-S1  . Vitamin D deficiency     Past Surgical History:  Procedure Laterality Date  . APPENDECTOMY    . COLONOSCOPY   12/14/2010   Normal  . cotisone injection  05/31/2016   every 3-4 months in left knee  . EYE SURGERY     bilateral cataract surgery with lens implants  . KNEE ARTHROSCOPY     dr supple- left knee  . LEFT HEART CATH AND CORONARY ANGIOGRAPHY N/A 11/10/2019   Procedure: LEFT HEART CATH AND CORONARY ANGIOGRAPHY;  Surgeon: Belva Crome, MD;  Location: [place] CV LAB;  Service: Cardiovascular;  Laterality: N/A;  . PROSTATE BIOPSY    . ROBOT ASSISTED LAPAROSCOPIC RADICAL PROSTATECTOMY N/A 08/07/2016   Procedure: ROBOTIC ASSISTED LAPAROSCOPIC RADICAL PROSTATECTOMY LEVEL 1;  Surgeon: Raynelle Bring, MD;  Location: [facility];  Service: Urology;  Laterality: N/A;    Current Medications: Current Meds  Medication Sig  . amLODipine-benazepril (LOTREL) 5-10 MG per capsule take 1 capsule by mouth once daily  . aspirin EC 81 MG tablet Take 81 mg by mouth daily.  Marland Kitchen atorvastatin (LIPITOR) 40 MG tablet Take 1 tablet (40 mg total) by mouth daily.  . Vitamin D, Ergocalciferol, (DRISDOL) 1.25 MG (50000 UNIT) CAPS capsule Take 50,000 Units by mouth every 7 (seven) days. Thursday   Current Facility-Administered Medications for the 02/13/20 encounter (Office Visit) with Jerline Pain, MD  Medication  . sodium chloride flush (NS) 0.9 % injection 3 mL     Allergies:   Sulfa antibiotics   Social History   Socioeconomic History  . Marital status: Married    Spouse name: Not on file  . Number of children: Not on file  . Years of education: Not on file  . Highest education level: Not on file  Occupational History  . Not on file  Tobacco Use  . Smoking status: Never Smoker  . Smokeless tobacco: Never Used  Substance and Sexual Activity  . Alcohol use: Yes    Alcohol/week: 5.0 standard drinks    Types: 5 Cans of beer per week  . Drug use: No  . Sexual activity: Yes  Other Topics Concern  . Not on file  Social History Narrative  . Not on file   Social Determinants of Health   Financial Resource  Strain:   . Difficulty of Paying Living Expenses:   Food Insecurity:   . Worried About Charity fundraiser in the Last Year:   . Arboriculturist in the Last Year:   Transportation Needs:   . Film/video editor (Medical):   Marland Kitchen Lack of Transportation (Non-Medical):   Physical Activity:   . Days of Exercise per Week:   . Minutes of Exercise per Session:   Stress:   . Feeling of Stress :   Social Connections:   . Frequency of Communication with Friends and Family:   . Frequency of Social Gatherings with Friends and Family:   . Attends Religious Services:   . Active Member of Clubs or Organizations:   . Attends Archivist Meetings:   Marland Kitchen Marital Status:      Family History: The patient's family history includes Arthritis in his maternal grandfather; Breast cancer in his mother; Colon polyps in his father; Dementia in his mother; Depression in his sister; Heart disease in his father; Hyperlipidemia  in his father, mother, and sister; Hypertension in his father, mother, and sister.  ROS:   Please see the history of present illness.     All other systems reviewed and are negative.  EKGs/Labs/Other Studies Reviewed:    The following studies were reviewed today: As above  EKG:  EKG is not ordered today.    Recent Labs: 11/07/2019: BUN 13; Creatinine, Ser 1.08; Hemoglobin 15.4; Platelets 272; Potassium 4.4; Sodium 135  Recent Lipid Panel    Component Value Date/Time   CHOL 186 09/16/2012 0858   TRIG 165.0 (H) 09/16/2012 0858   HDL 50.50 09/16/2012 0858   CHOLHDL 4 09/16/2012 0858   VLDL 33.0 09/16/2012 0858   LDLCALC 103 (H) 09/16/2012 0858    Physical Exam:    VS:  BP 122/76   Pulse 61   Ht 6\' 2"  (1.88 m)   Wt 249 lb 12.8 oz (113.3 kg)   SpO2 98%   BMI 32.07 kg/m     Wt Readings from Last 3 Encounters:  02/13/20 249 lb 12.8 oz (113.3 kg)  11/10/19 250 lb (113.4 kg)  11/07/19 258 lb (117 kg)     GEN:  Well nourished, well developed in no acute  distress HEENT: Normal NECK: No JVD; No carotid bruits LYMPHATICS: No lymphadenopathy CARDIAC: RRR, no murmurs, rubs, gallops RESPIRATORY:  Clear to auscultation without rales, wheezing or rhonchi  ABDOMEN: Soft, non-tender, non-distended MUSCULOSKELETAL:  No edema; No deformity  SKIN: Warm and dry NEUROLOGIC:  Alert and oriented x 3 PSYCHIATRIC:  Normal affect   ASSESSMENT:    1. Coronary artery disease involving native coronary artery of native heart without angina pectoris   2. Aortic atherosclerosis (HCC)   3. Pure hypercholesterolemia   4. Medication management    PLAN:    In order of problems listed above:  Coronary artery disease/coronary artery calcification/atherosclerosis -Minimal luminal irregularities on cardiac catheterization performed in April 2021.  He was quite surprised.  Trying to work on his diet, less cheese he states. -Continue with aspirin 81 mg. -Continue with high intensity statin atorvastatin 40.  May need to increase.  Aortic atherosclerosis -Intensifying statin therapy.  Given his dense coronary calcification and concomitant aortic atherosclerosis, we will go ahead and check a carotid Doppler to ensure that he does not have any significant disease.  No prior stroke history.  Hyperlipidemia -LDL is above.  Goal 70.  Increasing atorvastatin to 40 at prior visit.  May need additional Zetia to obtain goal or PCSK9 if necessary. Got blood work today.   Family history of CAD -Father died suddenly in his late 45s from MI.  Risk factor prevention.    Medication Adjustments/Labs and Tests Ordered: Current medicines are reviewed at length with the patient today.  Concerns regarding medicines are outlined above.  No orders of the defined types were placed in this encounter.  No orders of the defined types were placed in this encounter.   Patient Instructions  Medication Instructions:  The current medical regimen is effective;  continue present plan  and medications.  *If you need a refill on your cardiac medications before your next appointment, please call your pharmacy*  Follow-Up: At [facility], you and your health needs are our priority.  As part of our continuing mission to provide you with exceptional heart care, we have created designated Provider Care Teams.  These Care Teams include your primary Cardiologist (physician) and Advanced Practice Providers (APPs -  Physician Assistants and Nurse Practitioners) who all work  together to provide you with the care you need, when you need it.  We recommend signing up for the patient portal called "MyChart".  Sign up information is provided on this After Visit Summary.  MyChart is used to connect with patients for Virtual Visits (Telemedicine).  Patients are able to view lab/test results, encounter notes, upcoming appointments, etc.  Non-urgent messages can be sent to your provider as well.   To learn more about what you can do with MyChart, go to [email or web address].    Your next appointment:   6 month(s)  The format for your next appointment:   In Person  Provider:   Candee Furbish, MD   Thank you for choosing [facility]!!        Signed, Candee Furbish, MD  02/13/2020 9:01 AM    [place]

## 2020-02-18 ENCOUNTER — Telehealth: Payer: Self-pay | Admitting: *Deleted

## 2020-02-18 DIAGNOSIS — Z79899 Other long term (current) drug therapy: Secondary | ICD-10-CM

## 2020-02-18 DIAGNOSIS — E7800 Pure hypercholesterolemia, unspecified: Secondary | ICD-10-CM

## 2020-02-18 MED ORDER — ROSUVASTATIN CALCIUM 40 MG PO TABS: 40.0000 mg | ORAL_TABLET | Freq: Every day | ORAL | 3 refills | Status: DC

## 2020-02-18 NOTE — Telephone Encounter (Signed)
Pt aware.

## 2020-03-03 DIAGNOSIS — I6523 Occlusion and stenosis of bilateral carotid arteries: Secondary | ICD-10-CM | POA: Diagnosis not present

## 2020-03-03 DIAGNOSIS — I2510 Atherosclerotic heart disease of native coronary artery without angina pectoris: Secondary | ICD-10-CM | POA: Diagnosis not present

## 2020-03-03 DIAGNOSIS — I10 Essential (primary) hypertension: Secondary | ICD-10-CM | POA: Diagnosis not present

## 2020-03-03 DIAGNOSIS — R7301 Impaired fasting glucose: Secondary | ICD-10-CM | POA: Diagnosis not present

## 2020-03-03 DIAGNOSIS — E669 Obesity, unspecified: Secondary | ICD-10-CM | POA: Diagnosis not present

## 2020-05-21 ENCOUNTER — Other Ambulatory Visit: Payer: BC Managed Care – PPO

## 2020-05-28 DIAGNOSIS — Z23 Encounter for immunization: Secondary | ICD-10-CM | POA: Diagnosis not present

## 2020-05-31 ENCOUNTER — Other Ambulatory Visit: Payer: BC Managed Care – PPO | Admitting: *Deleted

## 2020-05-31 ENCOUNTER — Other Ambulatory Visit: Payer: Self-pay

## 2020-05-31 DIAGNOSIS — Z79899 Other long term (current) drug therapy: Secondary | ICD-10-CM | POA: Diagnosis not present

## 2020-05-31 DIAGNOSIS — E7800 Pure hypercholesterolemia, unspecified: Secondary | ICD-10-CM | POA: Diagnosis not present

## 2020-05-31 LAB — LIPID PANEL
Chol/HDL Ratio: 3.5 ratio (ref 0.0–5.0)
Cholesterol, Total: 149 mg/dL (ref 100–199)
HDL: 42 mg/dL (ref >39)
LDL Chol Calc (NIH): 90 mg/dL (ref 0–99)
Triglycerides: 90 mg/dL (ref 0–149)
VLDL Cholesterol Cal: 17 mg/dL (ref 5–40)

## 2020-05-31 LAB — ALT: ALT: 26 IU/L (ref 0–44)

## 2020-06-02 ENCOUNTER — Telehealth: Payer: Self-pay | Admitting: *Deleted

## 2020-06-02 DIAGNOSIS — E7800 Pure hypercholesterolemia, unspecified: Secondary | ICD-10-CM

## 2020-06-02 DIAGNOSIS — Z79899 Other long term (current) drug therapy: Secondary | ICD-10-CM

## 2020-06-02 MED ORDER — EZETIMIBE 10 MG PO TABS: 10.0000 mg | ORAL_TABLET | Freq: Every day | ORAL | 3 refills | Status: DC

## 2020-06-02 NOTE — Telephone Encounter (Signed)
Jerline Pain, MD  06/01/2020 6:47 AM EDT     LDL 90 from 106 3 months ago. Goal is less than 70. Lets add Zetia 10 mg once a day. Repeat lipid panel in 3 months. Candee Furbish, MD   Pt requests RX be sent into [facility].  RX sent into pharmacy as requested.  Lipid panel ordered and scheduled for 3 months.

## 2020-09-03 ENCOUNTER — Other Ambulatory Visit: Payer: BC Managed Care – PPO

## 2020-10-29 DIAGNOSIS — E785 Hyperlipidemia, unspecified: Secondary | ICD-10-CM | POA: Diagnosis not present

## 2020-10-29 DIAGNOSIS — R7301 Impaired fasting glucose: Secondary | ICD-10-CM | POA: Diagnosis not present

## 2020-10-29 DIAGNOSIS — C61 Malignant neoplasm of prostate: Secondary | ICD-10-CM | POA: Diagnosis not present

## 2020-10-29 DIAGNOSIS — E291 Testicular hypofunction: Secondary | ICD-10-CM | POA: Diagnosis not present

## 2020-10-29 DIAGNOSIS — R82998 Other abnormal findings in urine: Secondary | ICD-10-CM | POA: Diagnosis not present

## 2020-10-29 DIAGNOSIS — Z1389 Encounter for screening for other disorder: Secondary | ICD-10-CM | POA: Diagnosis not present

## 2020-10-29 DIAGNOSIS — Z1331 Encounter for screening for depression: Secondary | ICD-10-CM | POA: Diagnosis not present

## 2020-10-29 DIAGNOSIS — Z125 Encounter for screening for malignant neoplasm of prostate: Secondary | ICD-10-CM | POA: Diagnosis not present

## 2020-10-29 DIAGNOSIS — E559 Vitamin D deficiency, unspecified: Secondary | ICD-10-CM | POA: Diagnosis not present

## 2020-10-29 DIAGNOSIS — Z0000 Encounter for general adult medical examination without abnormal findings: Secondary | ICD-10-CM | POA: Diagnosis not present

## 2020-10-29 DIAGNOSIS — E782 Mixed hyperlipidemia: Secondary | ICD-10-CM | POA: Diagnosis not present

## 2020-10-29 DIAGNOSIS — I10 Essential (primary) hypertension: Secondary | ICD-10-CM | POA: Diagnosis not present

## 2021-02-18 ENCOUNTER — Other Ambulatory Visit: Payer: Self-pay | Admitting: Cardiology

## 2021-04-12 ENCOUNTER — Encounter: Payer: Self-pay | Admitting: Internal Medicine

## 2021-05-02 DIAGNOSIS — R7301 Impaired fasting glucose: Secondary | ICD-10-CM | POA: Diagnosis not present

## 2021-05-02 DIAGNOSIS — Z1331 Encounter for screening for depression: Secondary | ICD-10-CM | POA: Diagnosis not present

## 2021-05-02 DIAGNOSIS — Z1339 Encounter for screening examination for other mental health and behavioral disorders: Secondary | ICD-10-CM | POA: Diagnosis not present

## 2021-05-17 ENCOUNTER — Other Ambulatory Visit: Payer: Self-pay | Admitting: Cardiology

## 2021-07-27 ENCOUNTER — Other Ambulatory Visit: Payer: Self-pay | Admitting: Cardiology

## 2021-07-27 DIAGNOSIS — R319 Hematuria, unspecified: Secondary | ICD-10-CM | POA: Diagnosis not present

## 2021-08-23 ENCOUNTER — Other Ambulatory Visit: Payer: Self-pay

## 2021-08-23 ENCOUNTER — Telehealth: Payer: Self-pay | Admitting: Cardiology

## 2021-08-23 MED ORDER — EZETIMIBE 10 MG PO TABS: 10.0000 mg | ORAL_TABLET | Freq: Every day | ORAL | 1 refills | Status: DC

## 2021-08-23 NOTE — Telephone Encounter (Signed)
°*  STAT* If patient is at the pharmacy, call can be transferred to refill team.   1. Which medications need to be refilled? (please list name of each medication and dose if known) ezetimibe (ZETIA) 10 MG tablet  2. Which pharmacy/location (including street and city if local pharmacy) is medication to be sent to? [place], [place] - [address] AT [address] [place]  3. Do they need a 30 day or 90 day supply? [place]

## 2021-11-07 DIAGNOSIS — R319 Hematuria, unspecified: Secondary | ICD-10-CM | POA: Diagnosis not present

## 2021-11-07 DIAGNOSIS — N3289 Other specified disorders of bladder: Secondary | ICD-10-CM | POA: Diagnosis not present

## 2021-11-07 DIAGNOSIS — K429 Umbilical hernia without obstruction or gangrene: Secondary | ICD-10-CM | POA: Diagnosis not present

## 2021-11-07 DIAGNOSIS — R310 Gross hematuria: Secondary | ICD-10-CM | POA: Diagnosis not present

## 2021-11-07 DIAGNOSIS — I10 Essential (primary) hypertension: Secondary | ICD-10-CM | POA: Diagnosis not present

## 2021-11-08 DIAGNOSIS — N3289 Other specified disorders of bladder: Secondary | ICD-10-CM | POA: Diagnosis not present

## 2021-11-08 DIAGNOSIS — K429 Umbilical hernia without obstruction or gangrene: Secondary | ICD-10-CM | POA: Diagnosis not present

## 2021-11-18 DIAGNOSIS — R310 Gross hematuria: Secondary | ICD-10-CM | POA: Diagnosis not present

## 2021-11-18 DIAGNOSIS — D414 Neoplasm of uncertain behavior of bladder: Secondary | ICD-10-CM | POA: Diagnosis not present

## 2021-11-21 ENCOUNTER — Other Ambulatory Visit: Payer: Self-pay | Admitting: Urology

## 2021-11-25 NOTE — Progress Notes (Addendum)
COVID Vaccine Completed: yes x4 ?Date COVID Vaccine completed: ?Has received booster: ?COVID vaccine manufacturer: Benjamin  ? ?Date of COVID positive in last 90 days: no ? ?PCP - Reynold Bowen, MD ?Cardiologist - Candee Furbish, MD ? ?Chest x-ray - n/a ?EKG - 11/28/21 Epic/chart ?Stress Test - 02/06/17 Epic ?ECHO - 11/28/19 Epic  ?Cardiac Cath - 11/10/19 Epic  ?Pacemaker/ICD device last checked: n/a ?Spinal Cord Stimulator: n/a ? ?Bowel Prep - no ? ?Sleep Study - n/a ?CPAP -  ? ?Fasting Blood Sugar - n/a ?Checks Blood Sugar _____ times a day ? ?Blood Thinner Instructions: n/a ?Aspirin Instructions: ?Last Dose: ? ?Activity level: Can go up a flight of stairs and perform activities of daily living without stopping and without symptoms of chest pain or shortness of breath. ?    ? ?Anesthesia review: HTN, CAD, aortic atherosclerosis  ? ?Patient denies shortness of breath, fever, cough and chest pain at PAT appointment ? ? ?Patient verbalized understanding of instructions that were given to them at the PAT appointment. Patient was also instructed that they will need to review over the PAT instructions again at home before surgery.  ?

## 2021-11-25 NOTE — Patient Instructions (Addendum)
DUE TO COVID-19 ONLY TWO VISITORS  (aged 64 and older)  ARE ALLOWED TO COME WITH YOU AND STAY IN THE WAITING ROOM ONLY DURING PRE OP AND PROCEDURE.   ?**NO VISITORS ARE ALLOWED IN THE SHORT STAY AREA OR RECOVERY ROOM!!** ? ?IF YOU WILL BE ADMITTED INTO THE HOSPITAL YOU ARE ALLOWED ONLY FOUR SUPPORT PEOPLE DURING VISITATION HOURS ONLY (7 AM -8PM)   ?The support person(s) must pass our screening, gel in and out, and wear a mask at all times, including in the patient?s room. ?Patients must also wear a mask when staff or their support person are in the room. ?Visitors GUEST BADGE MUST BE WORN VISIBLY  ?One adult visitor may remain with you overnight and MUST be in the room by 8 P.M. ?  ? ? Your procedure is scheduled on: 11/29/21 ? ? Report to [facility] Main Entrance ? ?  Report to admitting at 5:15 AM ? ? Call this number if you have problems the morning of surgery [phone] ? ? Do not eat food :After Midnight. ? ? After Midnight you may have the following liquids until 4:30 AM DAY OF SURGERY ? ?Water ?Black Coffee (sugar ok, NO MILK/CREAM OR CREAMERS)  ?Tea (sugar ok, NO MILK/CREAM OR CREAMERS) regular and decaf                             ?Plain Jell-O (NO RED)                                           ?Fruit ices (not with fruit pulp, NO RED)                                     ?Popsicles (NO RED)                                                                  ?Juice: apple, WHITE grape, WHITE cranberry ?Sports drinks like Gatorade (NO RED) ?Clear broth(vegetable,chicken,beef) ? ?FOLLOW BOWEL PREP AND ANY ADDITIONAL PRE OP INSTRUCTIONS YOU RECEIVED FROM YOUR SURGEON'S OFFICE!!! ?  ?  ?Oral Hygiene is also important to reduce your risk of infection.                                    ?Remember - BRUSH YOUR TEETH THE MORNING OF SURGERY WITH YOUR REGULAR TOOTHPASTE ? ? Take these medicines the morning of surgery with A SIP OF WATER: Zetia, Rosuvastatin.  ?                  ?           You may not have any  metal on your body including jewelry, and body piercing ? ?           Do not wear lotions, powders, cologne, or deodorant ? ?            Men may shave face and neck. ? ?  Do not bring valuables to the hospital. [facility] NOT ?            RESPONSIBLE   FOR VALUABLES. ? ? Bring small overnight bag day of surgery. ? ?            Please read over the following fact sheets you were given: IF [place] [phone]- Apolonio Schneiders ? ?   [place] - Preparing for Surgery ?Before surgery, you can play an important role.  Because skin is not sterile, your skin needs to be as free of germs as possible.  You can reduce the number of germs on your skin by washing with CHG (chlorahexidine gluconate) soap before surgery.  CHG is an antiseptic cleaner which kills germs and bonds with the skin to continue killing germs even after washing. ?Please DO NOT use if you have an allergy to CHG or antibacterial soaps.  If your skin becomes reddened/irritated stop using the CHG and inform your nurse when you arrive at Short Stay. ?Do not shave (including legs and underarms) for at least 48 hours prior to the first CHG shower.  You may shave your face/neck. ? ?Please follow these instructions carefully: ? 1.  Shower with CHG Soap the night before surgery and the  morning of surgery. ? 2.  If you choose to wash your hair, wash your hair first as usual with your normal  shampoo. ? 3.  After you shampoo, rinse your hair and body thoroughly to remove the shampoo.                            ? 4.  Use CHG as you would any other liquid soap.  You can apply chg directly to the skin and wash.  Gently with a scrungie or clean washcloth. ? 5.  Apply the CHG Soap to your body ONLY FROM THE NECK DOWN.   Do   not use on face/ open      ?                     Wound or open sores. Avoid contact with eyes, ears mouth and   genitals (private parts).  ?                     Production manager,  Genitals (private parts)  with your normal soap. ?            6.  Wash thoroughly, paying special attention to the area where your    surgery  will be performed. ? 7.  Thoroughly rinse your body with warm water from the neck down. ? 8.  DO NOT shower/wash with your normal soap after using and rinsing off the CHG Soap. ?               9.  Pat yourself dry with a clean towel. ?           10.  Wear clean pajamas. ?           11.  Place clean sheets on your bed the night of your first shower and do not  sleep with pets. ?Day of Surgery : ?Do not apply any lotions/deodorants the morning of surgery.  Please wear clean clothes to the hospital/surgery center. ? ?FAILURE TO FOLLOW THESE INSTRUCTIONS MAY RESULT IN THE CANCELLATION OF YOUR SURGERY ? ?PATIENT SIGNATURE_________________________________ ? ?  NURSE SIGNATURE__________________________________ ? ?________________________________________________________________________  ?

## 2021-11-28 ENCOUNTER — Inpatient Hospital Stay (HOSPITAL_COMMUNITY): Admission: RE | Admit: 2021-11-28 | Payer: BC Managed Care – PPO | Source: Ambulatory Visit

## 2021-11-28 ENCOUNTER — Encounter (HOSPITAL_COMMUNITY)
Admission: RE | Admit: 2021-11-28 | Discharge: 2021-11-28 | Disposition: A | Discharge status: Home / Self Care | Payer: BC Managed Care – PPO | Source: Ambulatory Visit | Attending: Urology | Admitting: Urology

## 2021-11-28 ENCOUNTER — Other Ambulatory Visit: Payer: Self-pay

## 2021-11-28 ENCOUNTER — Encounter (HOSPITAL_COMMUNITY): Payer: Self-pay

## 2021-11-28 VITALS — BP 149/94 | HR 67 | Temp 97.8°F | Resp 14 | Ht 73.0 in | Wt 273.0 lb

## 2021-11-28 DIAGNOSIS — Z01818 Encounter for other preprocedural examination: Secondary | ICD-10-CM | POA: Diagnosis not present

## 2021-11-28 DIAGNOSIS — I10 Essential (primary) hypertension: Secondary | ICD-10-CM | POA: Diagnosis not present

## 2021-11-28 LAB — BASIC METABOLIC PANEL
Anion gap: 6 (ref 5–15)
BUN: 22 mg/dL (ref 8–23)
CO2: 23 mmol/L (ref 22–32)
Calcium: 8.9 mg/dL (ref 8.9–10.3)
Chloride: 110 mmol/L (ref 98–111)
Creatinine, Ser: 1 mg/dL (ref 0.61–1.24)
GFR, Estimated: >60 mL/min (ref >60)
Glucose, Bld: 119 mg/dL — ABNORMAL HIGH (ref 70–99)
Potassium: 4.4 mmol/L (ref 3.5–5.1)
Sodium: 139 mmol/L (ref 135–145)

## 2021-11-28 LAB — CBC
HCT: 40.7 % (ref 39.0–52.0)
Hemoglobin: 13.5 g/dL (ref 13.0–17.0)
MCH: 28.2 pg (ref 26.0–34.0)
MCHC: 33.2 g/dL (ref 30.0–36.0)
MCV: 85 fL (ref 80.0–100.0)
Platelets: 327 10*3/uL (ref 150–400)
RBC: 4.79 MIL/uL (ref 4.22–5.81)
RDW: 13.2 % (ref 11.5–15.5)
WBC: 6.9 10*3/uL (ref 4.0–10.5)
nRBC: 0 % (ref 0.0–0.2)

## 2021-11-28 NOTE — H&P (Signed)
I have blood in my urine. ? ?  ? Austin Moses returns today in f/u. He has a history of prostate cancer and had a RALP in 2018 by Dr. Alinda Money. He was seen in the ER at the [facility] for gross hematuria. He had transient blood in the urine around Thanksgiving and he saw Dr. Forde Dandy and had a clear urine. On 11/07/21 he had the acute onset of gross hematuria. His UA had pyuria, bacteriuria and the hematuria. He had a mild leukocytosis. He had a CT that showed a 9cm mass consistent with a clot. He was treated with Vantin. I don't see that a culture was done. He has subsequently passed a large amount of clot. He had some intermittent obstruction with the clot passage and pain with that. He had no flank pain or fever. His last PSA was 0.0 with Dr. Forde Dandy. He had a UA on 4/1 that was clear. He is on an '81mg'$  ASA. He has a good stream and minimal incontinence.  ? ?  ?ALLERGIES: Sulfa Drugs ?  ? ?MEDICATIONS: Aspirin 81 mg tablet,chewable  ?Lipitor 40 mg tablet  ?Amlodipine Besilate  ?Vitamin D3  ?  ? ?GU PSH: Prostate Needle Biopsy - 2017 ?Robotic Radical Prostatectomy - 2018 ? ?  ?   ?PSH Notes: Knee Surgery, Cataract Surgery  ? ?NON-GU PSH: Surgical Pathology, Gross And Microscopic Examination For Prostate Needle - 2017 ? ?  ? ?GU PMH: Stress Incontinence - 2018, - 2018, - 2018, - 2018, - 2018, - 2018 ?ED due to arterial insufficiency, SHIM is 11. - 2017 ?Primary hypogonadism - 2017, Hypogonadism, testicular, - 2015 ?Prostate Cancer, T1c Nx Mx Gleason 6 very low risk prostate cancer. - 2017 ?Elevated PSA - 2017, - 2017, Elevated prostate specific antigen (PSA), - 2015 ?Encounter for Prostate Cancer screening - 2017 ?  ?   ?PMH Notes:  ? ?1) Prostate cancer: He is s/p a BNS RAL radical prostatectomy on 08/07/16.  ? ?Diagnosis: pT2c Nx Mx, Gleason 3+4=7 adenocarcinoma with negative surgical margins  ?Pretreatment PSA: 6.67  ?Pretreatment SHIM: 2  ? ?NON-GU PMH: Arthritis ?Hypercholesterolemia ?Hypertension ?  ? ?FAMILY HISTORY: Acute  Myocardial Infarction - Father ?Death of family member - Mother, Father ?malignant neoplasm of male breast - Mother  ? ?SOCIAL HISTORY: Marital Status: Married ?Preferred Language: [place]; Ethnicity: Not Hispanic Or Latino; Race: White ?Current Smoking Status: Patient has never smoked.  ?Does not use smokeless tobacco. ?Social Drinker.  ?Does not use drugs. ?Drinks 4+ caffeinated drinks per day. ?  ?  Notes: Never a smoker, Occupation, Number of children, Alcohol Use, Marital History - Currently Married, Caffeine Use, Tobacco Use  ? ?REVIEW OF SYSTEMS:    ?GU Review Male:   Patient reports frequent urination, burning/ pain with urination, get up at night to urinate, and trouble starting your stream. Patient denies hard to postpone urination, leakage of urine, stream starts and stops, have to strain to urinate , erection problems, and penile pain.  ?Gastrointestinal (Upper):   Patient denies nausea, vomiting, and indigestion/ heartburn.  ?Gastrointestinal (Lower):   Patient denies diarrhea and constipation.  ?Constitutional:   Patient denies fever, night sweats, weight loss, and fatigue.  ?Skin:   Patient denies skin rash/ lesion and itching.  ?Eyes:   Patient denies blurred vision and double vision.  ?Ears/ Nose/ Throat:   Patient denies sore throat and sinus problems.  ?Hematologic/Lymphatic:   Patient denies swollen glands and easy bruising.  ?Cardiovascular:   Patient denies leg swelling and chest  pains.  ?Respiratory:   Patient denies cough and shortness of breath.  ?Endocrine:   Patient denies excessive thirst.  ?Musculoskeletal:   Patient denies back pain and joint pain.  ?Neurological:   Patient denies headaches and dizziness.  ?Psychologic:   Patient denies anxiety and depression.  ? ?Notes: Blood in urine, urinary tract infection ?  ? ?VITAL SIGNS:    ?  11/18/2021 09:23 AM  ?Weight 270 lb / 122.47 kg  ?Height 73 in / 185.42 cm  ?BP 131/83 mmHg  ?Heart Rate 69 /min  ?Temperature 98.4 F / 36.8 C  ?BMI  35.6 kg/m?  ? ?MULTI-SYSTEM PHYSICAL EXAMINATION:    ?Constitutional: Well-nourished. No physical deformities. Normally developed. Good grooming.  ?Neck: Neck symmetrical, not swollen. Normal tracheal position.  ?Respiratory: Normal breath sounds. No labored breathing, no use of accessory muscles.   ?Cardiovascular: Regular rate and rhythm. No murmur, no gallop. Marland Kitchen   ?Skin: No paleness, no jaundice, no cyanosis. No lesion, no ulcer, no rash.  ?Neurologic / Psychiatric: Oriented to time, oriented to place, oriented to person. No depression, no anxiety, no agitation.  ?Gastrointestinal: No hernia. , no tenderness, no rigidity, non obese abdomen.   ?Musculoskeletal: Normal gait and station of head and neck.  ? ?  ?Complexity of Data:  ?Lab Test Review:   PSA, BMP, CBC with Diff  ?Records Review:   Previous Doctor Records, Previous Hospital Records, Previous Patient Records  ?Urine Test Review:   Urinalysis  ?X-Ray Review: [facility] Protocol: Reviewed Report. Discussed With Patient.  ?  ? 06/20/17 11/15/16 02/11/16 12/23/15 03/26/14  ?PSA  ?Total PSA <0.015 ng/mL < 0.015 ng/dl 6.67  6.001 ng/dl 4.20   ?Free PSA   0.81   0.61   ?% Free PSA   [id number]   15   ? ? 03/25/14  ?Hormones  ?Testosterone, Total 267   ? ? ?PROCEDURES:    ? ?     Urinalysis w/Scope - 81001 ?Dipstick Dipstick Cont'd Micro  ?Color: Yellow Bilirubin: Neg WBC/hpf: 6 - 10/hpf  ?Appearance: Clear Ketones: Neg RBC/hpf: 40 - 60/hpf  ?Specific Gravity: 1.020 Blood: 3+ Bacteria: Few (10-25/hpf)  ?pH: 5.5 Protein: Trace Cystals: NS (Not Seen)  ?Glucose: Neg Urobilinogen: 0.2 Casts: NS (Not Seen)  ?  Nitrites: Neg Trichomonas: Not Present  ?  Leukocyte Esterase: Neg Mucous: Not Present  ?    Epithelial Cells: NS (Not Seen)  ?    Yeast: NS (Not Seen)  ?    Sperm: Not Present  ? ? ?Notes:  ? ?  ? ?ASSESSMENT:  ?    ICD-10 Details  ?1 GU:   Gross hematuria - R31.0 Chronic, Improving - He had his initial bout of hematuria in November and a more recent  severe recurrence. The bleeding is secondary to the bladder neoplasm.  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 There is an additional lesion on the floor of the bladder that is concerning for CIS. He needs a TURBT and may need left ureteral resection and stenting. I will do retrogrades and may do Gemcitabine if the tumor appears well resected and away from the orifice.  ? ?I have reviewed the risks of bleeding, infection, bladder wall injury, possible need for secondary procedure, possible need for ureteral resection with the need for a stent. Possible ureteral and urethral  strictures, chemical cystitis, secondary procedures, thrombotic events and anesthetic complications.  ? ?HE will likely need BCG if the cancer is non-muscle invasive.   ? ?PLAN:    ? ?      Schedule ?Return Visit/Planned Activity: Next Available Appointment - Schedule Surgery  ?Procedure: Unspecified Date - Cystoscopy TURBT >5 cm - 16109 ?Notes: next available  ? ? ?      Document ? ?

## 2021-11-28 NOTE — Anesthesia Preprocedure Evaluation (Addendum)
Anesthesia Evaluation  ?Patient identified by MRN, date of birth, ID band ?Patient awake ? ? ? ?Reviewed: ?Allergy & Precautions, NPO status , Patient's Chart, lab work & pertinent test results ? ?Airway ?Mallampati: III ? ?TM Distance: >3 FB ?Neck ROM: Full ? ? ? Dental ? ?(+) Teeth Intact, Dental Advisory Given ?  ?Pulmonary ? ?Snores at night, has never had sleep study, no witnessed apneas per pt  ?  ?Pulmonary exam normal ?breath sounds clear to auscultation ? ? ? ? ? ? Cardiovascular ?hypertension (158/88 in preop, per pt normally 130s/80s), Pt. 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 ?Cath 2021 ?? Mild to moderate diffuse three-vessel atherosclerosis/luminal irregularity and calcification.  No obstructive disease is noted. ?? Widely patent left main ?? 30% mid LAD.  LAD wraps around the left ventricular apex. ?? Codominant circumflex with no significant obstruction ?? Codominant right coronary with no significant obstruction. ?? Overall normal LV function.  EF 50 to 60%.  LVEDP is normal. ? ? ?Echo 2021 ??1. LV global longitudinal strain is -20.7%. Left ventricular ejection  ?fraction, by estimation, is 60 to 65%. The left ventricle has normal  ?function. The left ventricle has no regional wall motion abnormalities.  ?The left ventricular internal cavity size  ?was mildly dilated. Left ventricular diastolic parameters were normal.  ??2. Right ventricular systolic function is normal. The right ventricular  ?size is normal. There is normal pulmonary artery systolic pressure.  ??3. The mitral valve is normal in structure. Mild mitral valve  ?regurgitation.  ??4. The aortic valve is abnormal. Aortic valve regurgitation is not  ?visualized. Mild aortic valve sclerosis is present, with no evidence of  ?aortic valve stenosis.  ? ?Stress test 2018 ?? Blood pressure  demonstrated a normal response to exercise. ?? Upsloping ST segment depression ST segment depression of 1 mm was noted during stress in the II, III and aVF leads, and returning to baseline after less than 1 minute of recovery. ? ?  ?Neuro/Psych ?negative neurological ROS ? negative psych ROS  ? GI/Hepatic ?negative GI ROS, (+)  ?  ?  ? alcohol use and marijuana use, Etoh 2x/wk ?Marijuana occasionally  ?  ?Endo/Other  ?Obesity BMI 36 ? Renal/GU ?negative Renal ROS  ? ?Prostate ca  ? ?  ?Musculoskeletal ? ?(+) Arthritis , Osteoarthritis,   ? Abdominal ?(+) + obese,   ?Peds ? Hematology ?negative hematology ROS ?(+)   ?Anesthesia Other Findings ? ? Reproductive/Obstetrics ?negative OB ROS ? ?  ? ? ? ? ? ? ? ? ? ? ? ? ? ?  ?  ? ? ? ? ? ? ? ?Anesthesia Physical ?Anesthesia Plan ? ?ASA: 3 ? ?Anesthesia Plan: General  ? ?Post-op Pain Management:   ? ?Induction: Intravenous ? ?PONV Risk Score and Plan: 3 and Ondansetron, Dexamethasone, Midazolam and Treatment may vary due to age or medical condition ? ?Airway Management Planned: Oral ETT ? ?Additional Equipment: None ? ?Intra-op Plan:  ? ?Post-operative Plan: Extubation in OR ? ?Informed Consent: I have reviewed the patients History and Physical, chart, labs and discussed the procedure including the risks, benefits and alternatives for the proposed anesthesia with the patient or authorized representative who has indicated his/her understanding and acceptance.  ? ? ? ?Dental advisory given ? ?Plan Discussed with: CRNA ? ?Anesthesia Plan Comments:   ? ? ? ? ? ?Anesthesia Quick Evaluation ? ?

## 2021-11-29 ENCOUNTER — Ambulatory Visit (HOSPITAL_COMMUNITY): Payer: BC Managed Care – PPO

## 2021-11-29 ENCOUNTER — Encounter (HOSPITAL_COMMUNITY)
Admission: RE | Disposition: A | Discharge status: Home / Self Care | Payer: Self-pay | Source: Home / Self Care | Attending: Urology

## 2021-11-29 ENCOUNTER — Ambulatory Visit: Payer: BC Managed Care – PPO | Admitting: Cardiology

## 2021-11-29 ENCOUNTER — Ambulatory Visit (HOSPITAL_COMMUNITY): Payer: BC Managed Care – PPO | Admitting: Physician Assistant

## 2021-11-29 ENCOUNTER — Other Ambulatory Visit: Payer: Self-pay

## 2021-11-29 ENCOUNTER — Observation Stay (HOSPITAL_COMMUNITY)
Admission: RE | Admit: 2021-11-29 | Discharge: 2021-11-30 | Disposition: A | Discharge status: Home / Self Care | Payer: BC Managed Care – PPO | Attending: Urology | Admitting: Urology

## 2021-11-29 ENCOUNTER — Encounter (HOSPITAL_COMMUNITY): Payer: Self-pay | Admitting: Urology

## 2021-11-29 ENCOUNTER — Ambulatory Visit (HOSPITAL_COMMUNITY): Payer: BC Managed Care – PPO | Admitting: Certified Registered Nurse Anesthetist

## 2021-11-29 DIAGNOSIS — I10 Essential (primary) hypertension: Secondary | ICD-10-CM | POA: Diagnosis not present

## 2021-11-29 DIAGNOSIS — I2510 Atherosclerotic heart disease of native coronary artery without angina pectoris: Secondary | ICD-10-CM | POA: Diagnosis not present

## 2021-11-29 DIAGNOSIS — Z8546 Personal history of malignant neoplasm of prostate: Secondary | ICD-10-CM | POA: Insufficient documentation

## 2021-11-29 DIAGNOSIS — C679 Malignant neoplasm of bladder, unspecified: Secondary | ICD-10-CM | POA: Diagnosis not present

## 2021-11-29 DIAGNOSIS — E669 Obesity, unspecified: Secondary | ICD-10-CM | POA: Diagnosis not present

## 2021-11-29 DIAGNOSIS — C678 Malignant neoplasm of overlapping sites of bladder: Secondary | ICD-10-CM | POA: Diagnosis not present

## 2021-11-29 DIAGNOSIS — N329 Bladder disorder, unspecified: Secondary | ICD-10-CM | POA: Diagnosis not present

## 2021-11-29 DIAGNOSIS — Z7982 Long term (current) use of aspirin: Secondary | ICD-10-CM | POA: Insufficient documentation

## 2021-11-29 DIAGNOSIS — D494 Neoplasm of unspecified behavior of bladder: Secondary | ICD-10-CM | POA: Diagnosis not present

## 2021-11-29 DIAGNOSIS — Z79899 Other long term (current) drug therapy: Secondary | ICD-10-CM | POA: Insufficient documentation

## 2021-11-29 HISTORY — PX: TRANSURETHRAL RESECTION OF BLADDER TUMOR WITH MITOMYCIN-C: SHX6459

## 2021-11-29 SURGERY — TRANSURETHRAL RESECTION OF BLADDER TUMOR WITH MITOMYCIN-C
Anesthesia: General | Site: Urethra | Laterality: Bilateral

## 2021-11-29 MED ORDER — FENTANYL CITRATE (PF) 100 MCG/2ML IJ SOLN
INTRAMUSCULAR | Status: DC | PRN
  Administered 2021-11-29 (×2): 50 ug via INTRAVENOUS

## 2021-11-29 MED ORDER — HYDROMORPHONE HCL 1 MG/ML IJ SOLN: 0.5000 mg | INTRAMUSCULAR | Status: DC | PRN

## 2021-11-29 MED ORDER — OXYCODONE HCL 5 MG PO TABS
5.0000 mg | ORAL_TABLET | ORAL | Status: DC | PRN
  Administered 2021-11-29 (×2): 5 mg via ORAL
  Filled 2021-11-29 (×2): qty 1

## 2021-11-29 MED ORDER — MIDAZOLAM HCL 5 MG/5ML IJ SOLN
INTRAMUSCULAR | Status: DC | PRN
  Administered 2021-11-29: 2 mg via INTRAVENOUS

## 2021-11-29 MED ORDER — ACETAMINOPHEN 500 MG PO TABS
1000.0000 mg | ORAL_TABLET | Freq: Once | ORAL | Status: AC
  Administered 2021-11-29: 1000 mg via ORAL
  Filled 2021-11-29: qty 2

## 2021-11-29 MED ORDER — BENAZEPRIL HCL 20 MG PO TABS
10.0000 mg | ORAL_TABLET | Freq: Every day | ORAL | Status: DC
  Administered 2021-11-29: 10 mg via ORAL
  Filled 2021-11-29: qty 0.5

## 2021-11-29 MED ORDER — LACTATED RINGERS IV SOLN: INTRAVENOUS | Status: DC

## 2021-11-29 MED ORDER — PROPOFOL 10 MG/ML IV BOLUS
INTRAVENOUS | Status: AC
  Filled 2021-11-29: qty 20

## 2021-11-29 MED ORDER — OXYCODONE HCL 5 MG/5ML PO SOLN: 5.0000 mg | Freq: Once | ORAL | Status: DC | PRN

## 2021-11-29 MED ORDER — ONDANSETRON HCL 4 MG/2ML IJ SOLN: 4.0000 mg | Freq: Once | INTRAMUSCULAR | Status: DC | PRN

## 2021-11-29 MED ORDER — ONDANSETRON HCL 4 MG/2ML IJ SOLN
INTRAMUSCULAR | Status: DC | PRN
  Administered 2021-11-29: 4 mg via INTRAVENOUS

## 2021-11-29 MED ORDER — FLEET ENEMA 7-19 GM/118ML RE ENEM: 1.0000 | ENEMA | Freq: Once | RECTAL | Status: DC | PRN

## 2021-11-29 MED ORDER — BISACODYL 10 MG RE SUPP: 10.0000 mg | Freq: Every day | RECTAL | Status: DC | PRN

## 2021-11-29 MED ORDER — ROCURONIUM BROMIDE 10 MG/ML (PF) SYRINGE
PREFILLED_SYRINGE | INTRAVENOUS | Status: DC | PRN
  Administered 2021-11-29: 80 mg via INTRAVENOUS
  Administered 2021-11-29: 10 mg via INTRAVENOUS
  Administered 2021-11-29: 30 mg via INTRAVENOUS

## 2021-11-29 MED ORDER — OXYCODONE HCL 5 MG PO TABS: 5.0000 mg | ORAL_TABLET | Freq: Once | ORAL | Status: DC | PRN

## 2021-11-29 MED ORDER — DEXAMETHASONE SODIUM PHOSPHATE 10 MG/ML IJ SOLN
INTRAMUSCULAR | Status: DC | PRN
  Administered 2021-11-29: 10 mg via INTRAVENOUS

## 2021-11-29 MED ORDER — ACETAMINOPHEN 325 MG PO TABS: 650.0000 mg | ORAL_TABLET | ORAL | Status: DC | PRN

## 2021-11-29 MED ORDER — HYDROCODONE-ACETAMINOPHEN 5-325 MG PO TABS: 1.0000 | ORAL_TABLET | Freq: Four times a day (QID) | ORAL | 0 refills | Status: DC | PRN

## 2021-11-29 MED ORDER — ORAL CARE MOUTH RINSE: 15.0000 mL | Freq: Once | OROMUCOSAL | Status: AC

## 2021-11-29 MED ORDER — KETOROLAC TROMETHAMINE 30 MG/ML IJ SOLN: 30.0000 mg | Freq: Once | INTRAMUSCULAR | Status: DC | PRN

## 2021-11-29 MED ORDER — FENTANYL CITRATE (PF) 100 MCG/2ML IJ SOLN
INTRAMUSCULAR | Status: AC
  Filled 2021-11-29: qty 2

## 2021-11-29 MED ORDER — OXYBUTYNIN CHLORIDE 5 MG PO TABS: 5.0000 mg | ORAL_TABLET | Freq: Three times a day (TID) | ORAL | Status: DC | PRN

## 2021-11-29 MED ORDER — PROPOFOL 10 MG/ML IV BOLUS
INTRAVENOUS | Status: DC | PRN
  Administered 2021-11-29: 200 mg via INTRAVENOUS

## 2021-11-29 MED ORDER — AMLODIPINE BESYLATE 5 MG PO TABS
5.0000 mg | ORAL_TABLET | Freq: Every day | ORAL | Status: DC
  Administered 2021-11-29: 5 mg via ORAL
  Filled 2021-11-29: qty 1

## 2021-11-29 MED ORDER — CEFAZOLIN SODIUM-DEXTROSE 2-4 GM/100ML-% IV SOLN
2.0000 g | INTRAVENOUS | Status: AC
Start: 2021-11-29 — End: 2021-11-29
  Administered 2021-11-29: 2 g via INTRAVENOUS
  Filled 2021-11-29: qty 100

## 2021-11-29 MED ORDER — SODIUM CHLORIDE 0.9 % IR SOLN
Status: DC | PRN
  Administered 2021-11-29: 6000 mL via INTRAVESICAL
  Administered 2021-11-29 (×2): 3000 mL via INTRAVESICAL
  Administered 2021-11-29: 1000 mL
  Administered 2021-11-29: 6000 mL via INTRAVESICAL

## 2021-11-29 MED ORDER — ONDANSETRON HCL 4 MG/2ML IJ SOLN: 4.0000 mg | INTRAMUSCULAR | Status: DC | PRN

## 2021-11-29 MED ORDER — ROSUVASTATIN CALCIUM 20 MG PO TABS
40.0000 mg | ORAL_TABLET | Freq: Every day | ORAL | Status: DC
  Administered 2021-11-29: 40 mg via ORAL
  Filled 2021-11-29: qty 2

## 2021-11-29 MED ORDER — AMISULPRIDE (ANTIEMETIC) 5 MG/2ML IV SOLN: 10.0000 mg | Freq: Once | INTRAVENOUS | Status: DC | PRN

## 2021-11-29 MED ORDER — SUGAMMADEX SODIUM 200 MG/2ML IV SOLN
INTRAVENOUS | Status: DC | PRN
  Administered 2021-11-29: 500 mg via INTRAVENOUS

## 2021-11-29 MED ORDER — LIDOCAINE 2% (20 MG/ML) 5 ML SYRINGE
INTRAMUSCULAR | Status: DC | PRN
  Administered 2021-11-29: 60 mg via INTRAVENOUS

## 2021-11-29 MED ORDER — CHLORHEXIDINE GLUCONATE 0.12 % MT SOLN
15.0000 mL | Freq: Once | OROMUCOSAL | Status: AC
  Administered 2021-11-29: 15 mL via OROMUCOSAL

## 2021-11-29 MED ORDER — SENNOSIDES-DOCUSATE SODIUM 8.6-50 MG PO TABS: 1.0000 | ORAL_TABLET | Freq: Every evening | ORAL | Status: DC | PRN

## 2021-11-29 MED ORDER — ONDANSETRON HCL 4 MG/2ML IJ SOLN
INTRAMUSCULAR | Status: AC
  Filled 2021-11-29: qty 2

## 2021-11-29 MED ORDER — DEXAMETHASONE SODIUM PHOSPHATE 10 MG/ML IJ SOLN
INTRAMUSCULAR | Status: AC
  Filled 2021-11-29: qty 1

## 2021-11-29 MED ORDER — EZETIMIBE 10 MG PO TABS: 10.0000 mg | ORAL_TABLET | Freq: Every day | ORAL | Status: DC

## 2021-11-29 MED ORDER — ZOLPIDEM TARTRATE 5 MG PO TABS: 5.0000 mg | ORAL_TABLET | Freq: Every evening | ORAL | Status: DC | PRN

## 2021-11-29 MED ORDER — MIDAZOLAM HCL 2 MG/2ML IJ SOLN
INTRAMUSCULAR | Status: AC
  Filled 2021-11-29: qty 2

## 2021-11-29 MED ORDER — POTASSIUM CHLORIDE IN NACL 20-0.45 MEQ/L-% IV SOLN
INTRAVENOUS | Status: DC
  Filled 2021-11-29 (×4): qty 1000

## 2021-11-29 MED ORDER — HYDROMORPHONE HCL 1 MG/ML IJ SOLN: 0.2500 mg | INTRAMUSCULAR | Status: DC | PRN

## 2021-11-29 SURGICAL SUPPLY — 29 items
BAG COUNTER SPONGE SURGICOUNT (BAG) ×3 IMPLANT
BAG DRN RND TRDRP ANRFLXCHMBR (UROLOGICAL SUPPLIES) ×1
BAG SPNG CNTER NS LX DISP (BAG) ×1
BAG URINE DRAIN 2000ML AR STRL (UROLOGICAL SUPPLIES) ×1 IMPLANT
BAG URO CATCHER STRL LF (MISCELLANEOUS) ×3 IMPLANT
CATH FOLEY 2WAY SLVR  5CC 20FR (CATHETERS) ×2
CATH FOLEY 2WAY SLVR 5CC 20FR (CATHETERS) IMPLANT
CATH FOLEY 3WAY 30CC 22FR (CATHETERS) IMPLANT
CATH URETL OPEN 5X70 (CATHETERS) ×1 IMPLANT
DRAPE FOOT SWITCH (DRAPES) ×3 IMPLANT
ELECT REM PT RETURN 15FT ADLT (MISCELLANEOUS) ×3 IMPLANT
GLOVE SS BIOGEL STRL SZ 7 (GLOVE) IMPLANT
GLOVE SUPERSENSE BIOGEL SZ 7 (GLOVE) ×1
GLOVE SURG SS PI 8.0 STRL IVOR (GLOVE) ×1 IMPLANT
GOWN STRL REUS W/ TWL XL LVL3 (GOWN DISPOSABLE) ×2 IMPLANT
GOWN STRL REUS W/TWL XL LVL3 (GOWN DISPOSABLE) ×4
HOLDER FOLEY CATH W/STRAP (MISCELLANEOUS) ×1 IMPLANT
KIT TURNOVER KIT A (KITS) ×3 IMPLANT
LOOP CUT BIPOLAR 24F LRG (ELECTROSURGICAL) ×1 IMPLANT
MANIFOLD NEPTUNE II (INSTRUMENTS) ×3 IMPLANT
MAT HALF PREVALON HALF STRYKER (MISCELLANEOUS) ×1 IMPLANT
PACK CYSTO (CUSTOM PROCEDURE TRAY) ×3 IMPLANT
SET IRRIG Y TYPE TUR BLADDER L (SET/KITS/TRAYS/PACK) ×2 IMPLANT
SUT ETHILON 3 0 PS 1 (SUTURE) IMPLANT
SYR 30ML LL (SYRINGE) IMPLANT
SYR TOOMEY IRRIG 70ML (MISCELLANEOUS) ×2
SYRINGE TOOMEY IRRIG 70ML (MISCELLANEOUS) IMPLANT
TUBING CONNECTING 10 (TUBING) ×3 IMPLANT
TUBING UROLOGY SET (TUBING) ×3 IMPLANT

## 2021-11-29 NOTE — Anesthesia Procedure Notes (Addendum)
Procedure Name: Intubation ?Date/Time: 11/29/2021 7:37 AM ?Performed by: Rosaland Lao, CRNA ?Pre-anesthesia Checklist: Patient identified, Emergency Drugs available, Suction available and Patient being monitored ?Patient Re-evaluated:Patient Re-evaluated prior to induction ?Oxygen Delivery Method: Circle system utilized ?Preoxygenation: Pre-oxygenation with 100% oxygen ?Induction Type: IV induction ?Ventilation: Two handed mask ventilation required ?Laryngoscope Size: Mac and 4 ?Grade View: Grade II ?Tube type: Oral ?Tube size: 7.0 mm ?Number of attempts: 1 ?Airway Equipment and Method: Stylet ?Placement Confirmation: ETT inserted through vocal cords under direct vision, positive ETCO2 and breath sounds checked- equal and bilateral ?Secured at: 22 cm ?Tube secured with: Tape ?Dental Injury: Teeth and Oropharynx as per pre-operative assessment  ?Comments: DL by paramedic student  ? ? ? ? ?

## 2021-11-29 NOTE — Anesthesia Postprocedure Evaluation (Signed)
Anesthesia Post Note ? ?Patient: Austin Moses ? ?Procedure(s) Performed: CYSTOSCOPY BILATERAL RETROGRADES TRANSURETHRAL RESECTION OF BLADDER TUMOR (Bilateral: Urethra) ? ?  ? ?Patient location during evaluation: PACU ?Anesthesia Type: General ?Level of consciousness: awake and alert, oriented and patient cooperative ?Pain management: pain level controlled ?Vital Signs Assessment: post-procedure vital signs reviewed and stable ?Respiratory status: spontaneous breathing, nonlabored ventilation and respiratory function stable ?Cardiovascular status: blood pressure returned to baseline and stable ?Postop Assessment: no apparent nausea or vomiting ?Anesthetic complications: no ? ? ?No notable events documented. ? ?Last Vitals:  ?Vitals:  ? 11/29/21 0930 11/29/21 0945  ?BP: 135/78 126/77  ?Pulse: 69 70  ?Resp: 12 16  ?Temp:    ?SpO2: 98% 95%  ?  ?Last Pain:  ?Vitals:  ? 11/29/21 0945  ?TempSrc:   ?PainSc: 0-No pain  ? ? ?  ?  ?  ?  ?  ?  ? ?Austin Moses ? ? ? ? ?

## 2021-11-29 NOTE — Interval H&P Note (Signed)
History and Physical Interval Note:  He has no further bleeding.  ? ?11/29/2021 ?7:19 AM ? ?Austin Moses  has presented today for surgery, with the diagnosis of BLADDER TUMOR.  The various methods of treatment have been discussed with the patient and family. After consideration of risks, benefits and other options for treatment, the patient has consented to  Procedure(s): ?CYSTOSCOPY BILATERAL RETROGRADES TRANSURETHRAL RESECTION OF BLADDER TUMOR POSSIBLE GEMCITABINE POSSIBLE STENT (Bilateral) as a surgical intervention.  The patient's history has been reviewed, patient examined, no change in status, stable for surgery.  I have reviewed the patient's chart and labs.  Questions were answered to the patient's satisfaction.   ? ? ?Irine Seal ? ? ?

## 2021-11-29 NOTE — Op Note (Signed)
Procedure: Transurethral resection of a large bladder tumor of overlapping sites. ? ?Preop diagnosis: Large bladder tumor on the left bladder and posterior bladder. ? ?Postop diagnosis: Same. ? ?Surgeon: Dr. Irine Seal. ? ?Anesthesia: General. ? ?Specimen: 1.  Bladder tumor chips. ?2.  Base of bladder tumor chips. ? ?Drains: 20 French Foley catheter. ? ?EBL: 100 mL. ? ?Complications: None. ? ?Indications: The patient is a 66 year old male who had previously done a radical prostatectomy on who recently had gross hematuria and was found on CT to have a 9 cm bladder mass.  Office cystoscopy confirmed a large papillary bladder tumor involving the left side of the bladder and some evidence of possible carcinoma in situ on the base.  The left ureteral orifice could not be seen on office cystoscopy. ? ?Procedure: He was taken to the operating room where he a general anesthetic was induced.  He he was intubated and paralyzed.  He was given Ancef.  He was placed in lithotomy position and fitted with PAS hose.  His perineum and genitalia were prepped with Betadine solution and he was draped in the usual sterile fashion. ? ?Cystoscopy was performed using a 21 [place] scope and 30 degree lens.  Examination revealed a normal urethra.  The external sphincter was intact.  The prostate was absent.  The bladder tumor was readily apparent beginning at the left bladder neck and extending anteriorly to just lateral of the left midline, superiorly for approximately 5 to 6 cm and posteriorly down to just lateral to the left ureteral orifice and then extending across the base of the bladder above the trigone across the midline and more of a flat type of tumor appearance.  The right ureteral orifice was unremarkable. ? ?After completion of diagnostic cystoscopy the 26 French continuous-flow resectoscope sheath was inserted.  This was fitted with an Beatrix Fetters handle with a 30 degree lens and a bipolar loop.  Saline was used the irrigant.  I  initially resected the large left lateral component of the tumor which measured at least 5 x 6 cm and extended across the midline with the papillary mass.  I evacuated the initial resected material and achieved hemostasis.  I then resected the bulk of the mass on the posterior bladder wall and fulgurated the surrounding abnormal mucosa that appeared consistent with carcinoma in situ or spreading papillary tumor.  Some additional tumor was resected around the margins of the primary mass with great care taken to avoid the left ureteral orifice.  This tissue was then removed and placed with the primary specimen. ? ?Once the bulk of the tumor had been removed and hemostasis had been achieved, I resected the base of the primary mass which had some solid features and was concerning for an invasive tumor.  I resected down where I thought there was muscle to better assess the depth of invasion.  The specimen was retrieved and will be sent separately for analysis.  Final hemostasis was achieved and inspection demonstrated no bleeding or retained tissue.  The left ureteral orifice remained intact.  The scope was removed and a 20 [place] Foley catheter was placed.  The balloon was filled with 10 mL of sterile fluid.  The catheter was irrigated with clear return and placed to straight drainage. ? ?I did not feel gemcitabine was indicated as he is going to need BCG treatment at a minimum and may need repeat resection or possibly even radical therapy so I did not think there would be value in the  instillation. ? ?He was taken down from lithotomy position, his anesthetic was reversed and he was moved to recovery room in stable condition.  There were no complications. ?

## 2021-11-29 NOTE — Transfer of Care (Signed)
Immediate Anesthesia Transfer of Care Note ? ?Patient: Austin Moses ? ?Procedure(s) Performed: CYSTOSCOPY BILATERAL RETROGRADES TRANSURETHRAL RESECTION OF BLADDER TUMOR (Bilateral: Urethra) ? ?Patient Location: PACU ? ?Anesthesia Type:General ? ?Level of Consciousness: awake, alert  and oriented ? ?Airway & Oxygen Therapy: Patient Spontanous Breathing and Patient connected to face mask ? ?Post-op Assessment: Report given to RN and Post -op Vital signs reviewed and stable ? ?Post vital signs: Reviewed and stable ? ?Last Vitals:  ?Vitals Value Taken Time  ?BP 172/98 11/29/21 0855  ?Temp    ?Pulse 80 11/29/21 0858  ?Resp 19 11/29/21 0858  ?SpO2 97 % 11/29/21 0858  ?Vitals shown include unvalidated device data. ? ?Last Pain:  ?Vitals:  ? 11/29/21 0549  ?TempSrc:   ?PainSc: 0-No pain  ?   ? ?  ? ?Complications: No notable events documented. ?

## 2021-11-30 ENCOUNTER — Encounter (HOSPITAL_COMMUNITY): Payer: Self-pay | Admitting: Urology

## 2021-11-30 DIAGNOSIS — Z79899 Other long term (current) drug therapy: Secondary | ICD-10-CM | POA: Diagnosis not present

## 2021-11-30 DIAGNOSIS — I10 Essential (primary) hypertension: Secondary | ICD-10-CM | POA: Diagnosis not present

## 2021-11-30 DIAGNOSIS — C678 Malignant neoplasm of overlapping sites of bladder: Secondary | ICD-10-CM | POA: Diagnosis not present

## 2021-11-30 DIAGNOSIS — Z7982 Long term (current) use of aspirin: Secondary | ICD-10-CM | POA: Diagnosis not present

## 2021-11-30 DIAGNOSIS — N329 Bladder disorder, unspecified: Secondary | ICD-10-CM | POA: Diagnosis not present

## 2021-11-30 DIAGNOSIS — Z8546 Personal history of malignant neoplasm of prostate: Secondary | ICD-10-CM | POA: Diagnosis not present

## 2021-11-30 LAB — SURGICAL PATHOLOGY

## 2021-11-30 LAB — HIV ANTIBODY (ROUTINE TESTING W REFLEX): HIV Screen 4th Generation wRfx: NONREACTIVE

## 2021-11-30 NOTE — Progress Notes (Signed)
At the time of discharge the patient was stable. He is alert, oriented x4 and ambulatory without assistance. Pt has voided now x3. Discharge instructions were reviewed, questions and concerns were denied.  ?

## 2021-11-30 NOTE — Discharge Summary (Signed)
Physician Discharge Summary  ?Patient ID: ?Vails Gate ?MRN: [id number] ?DOB/AGE: Mar 08, 1956 66 y.o. ? ?Admit date: 11/29/2021 ?Discharge date: 11/30/2021 ? ?Admission Diagnoses:  ?Malignant neoplasm of overlapping sites of bladder [facility]) ? ?Discharge Diagnoses:  ?Principal Problem: ?  Malignant neoplasm of overlapping sites of bladder ([place]) ? ? ?Past Medical History:  ?Diagnosis Date  ? Arthritis   ? Cancer [facility])   ? prostate cancer  ? Cancer of prostate [facility])   ? Cataract   ? Hyperlipidemia   ? Hypertension   ? Impaired fasting blood sugar   ? Joint pain   ? Low testosterone in male   ? Obesity   ? Protrusion of lumbar intervertebral disc   ? L3-L4, L5-S1  ? Vitamin D deficiency   ? ? ?Surgeries: Procedure(s): ?CYSTOSCOPY BILATERAL RETROGRADES TRANSURETHRAL RESECTION OF BLADDER TUMOR on 11/29/2021 ?  ?Consultants (if any):  ? ?Discharged Condition: Improved ? ?Hospital Course: Austin Moses is an 66 y.o. male who was admitted 11/29/2021 with a diagnosis of Malignant neoplasm of overlapping sites of bladder [facility]) and went to the operating room on 11/29/2021 and underwent the above named procedures.  He had a very large tumor with possible invasion but did well postop and is ready for discharge today after a voiding trial.   ? ?He was given perioperative antibiotics:  ?Anti-infectives (From admission, onward)  ? ? Start     Dose/Rate Route Frequency Ordered Stop  ? 11/29/21 0530  ceFAZolin (ANCEF) IVPB 2g/100 mL premix       ? 2 g ?200 mL/hr over 30 Minutes Intravenous 30 min pre-op 11/29/21 0530 11/29/21 0738  ? ?  ?. ? ?He was given sequential compression devices for DVT prophylaxis. ? ?He benefited maximally from the hospital stay and there were no complications.   ? ?Recent vital signs:  ?Vitals:  ? 11/30/21 0000 11/30/21 0509  ?BP: 124/65 131/73  ?Pulse: 61 61  ?Resp: 20 20  ?Temp: 98.3 ?F (36.8 ?C) 97.7 ?F (36.5 ?C)  ?SpO2: 96% 95%  ? ? ?Recent laboratory studies:  ?Lab Results  ?Component Value Date  ? HGB 13.5  11/28/2021  ? HGB 15.4 11/07/2019  ? HGB 12.8 (L) 08/08/2016  ? ?Lab Results  ?Component Value Date  ? WBC 6.9 11/28/2021  ? PLT 327 11/28/2021  ? ?No results found for: INR ?Lab Results  ?Component Value Date  ? NA 139 11/28/2021  ? K 4.4 11/28/2021  ? CL 110 11/28/2021  ? CO2 23 11/28/2021  ? BUN 22 11/28/2021  ? CREATININE 1.00 11/28/2021  ? GLUCOSE 119 (H) 11/28/2021  ? ? ?Discharge Medications:   ?Allergies as of 11/30/2021   ? ?   Reactions  ? Sulfa Antibiotics Rash  ? ?  ? ?  ?Medication List  ?  ? ?TAKE these medications   ? ?amLODipine-benazepril 5-10 MG capsule ?Commonly known as: LOTREL ?take 1 capsule by mouth once daily ?  ?ezetimibe 10 MG tablet ?Commonly known as: ZETIA ?Take 1 tablet (10 mg total) by mouth daily. ?  ?HYDROcodone-acetaminophen 5-325 MG tablet ?Commonly known as: NORCO/VICODIN ?Take 1 tablet by mouth every 6 (six) hours as needed for moderate pain. ?  ?ibuprofen 200 MG tablet ?Commonly known as: ADVIL ?Take 200-400 mg by mouth every 6 (six) hours as needed for moderate pain. ?  ?rosuvastatin 40 MG tablet ?Commonly known as: CRESTOR ?TAKE 1 TABLET(40 MG) BY MOUTH DAILY ?  ?Vitamin D (Ergocalciferol) 1.25 MG (50000 UNIT) Caps capsule ?Commonly known as:  DRISDOL ?Take 50,000 Units by mouth every Thursday. ?  ? ?  ? ? ?Diagnostic Studies: DG C-Arm 1-60 Min-No Report ? ?Result Date: 11/29/2021 ?Fluoroscopy was utilized by the requesting physician.  No radiographic interpretation.   ? ?Disposition: Discharge disposition: 01-Home or Self Care ? ? ? ? ? ? ?Discharge Instructions   ? ? Discontinue IV   Complete by: As directed ?  ? Foley catheter - discontinue   Complete by: As directed ?  ? ?  ? ? ? Follow-up Information   ? ? Irine Seal, MD Follow up on 12/09/2021.   ?Specialty: Urology ?Why: 215 ?Contact information: ?[address] ?[place] [address] ?[phone] ? ? ?  ?  ? ?  ?  ? ?  ? ? ? ?Signed: ?Irine Seal ?11/30/2021, 6:54 AM ?  ?

## 2021-12-02 ENCOUNTER — Other Ambulatory Visit: Payer: Self-pay | Admitting: Urology

## 2021-12-09 DIAGNOSIS — Z0000 Encounter for general adult medical examination without abnormal findings: Secondary | ICD-10-CM | POA: Diagnosis not present

## 2021-12-09 DIAGNOSIS — Z1331 Encounter for screening for depression: Secondary | ICD-10-CM | POA: Diagnosis not present

## 2021-12-09 DIAGNOSIS — E782 Mixed hyperlipidemia: Secondary | ICD-10-CM | POA: Diagnosis not present

## 2021-12-09 DIAGNOSIS — E559 Vitamin D deficiency, unspecified: Secondary | ICD-10-CM | POA: Diagnosis not present

## 2021-12-09 DIAGNOSIS — Z125 Encounter for screening for malignant neoplasm of prostate: Secondary | ICD-10-CM | POA: Diagnosis not present

## 2021-12-09 DIAGNOSIS — C61 Malignant neoplasm of prostate: Secondary | ICD-10-CM | POA: Diagnosis not present

## 2021-12-09 DIAGNOSIS — I700 Atherosclerosis of aorta: Secondary | ICD-10-CM | POA: Diagnosis not present

## 2021-12-09 DIAGNOSIS — C678 Malignant neoplasm of overlapping sites of bladder: Secondary | ICD-10-CM | POA: Diagnosis not present

## 2021-12-09 DIAGNOSIS — Z1389 Encounter for screening for other disorder: Secondary | ICD-10-CM | POA: Diagnosis not present

## 2021-12-14 NOTE — Progress Notes (Addendum)
Anesthesia Review:  PCP: DR Reynold Bowen  Cardiologist : DR Elta Guadeloupe skains [place] 02/13/20  Chest x-ray : EKG : 11/28/2021  Echo : 2021  Ct Card- 2021  Stress test: Cardiac Cath : 2021  Activity level:  can do a flight of stairs without difficulty  Sleep Study/ CPAP : Fasting Blood Sugar :      / Checks Blood Sugar -- times a day:   Blood Thinner/ Instructions /Last Dose: ASA / Instructions/ Last Dose :   11/28/21- cbc and bmp in epic  11/29/21- cysto  Labs to be done am of surgery.  PT was a phone call appt.

## 2021-12-19 ENCOUNTER — Encounter (HOSPITAL_COMMUNITY): Payer: Self-pay

## 2021-12-19 ENCOUNTER — Encounter (HOSPITAL_COMMUNITY)
Admission: RE | Admit: 2021-12-19 | Discharge: 2021-12-19 | Disposition: A | Discharge status: Home / Self Care | Payer: BC Managed Care – PPO | Source: Ambulatory Visit | Attending: Anesthesiology | Admitting: Anesthesiology

## 2021-12-19 ENCOUNTER — Other Ambulatory Visit: Payer: Self-pay

## 2021-12-19 ENCOUNTER — Encounter (HOSPITAL_COMMUNITY): Payer: Self-pay | Admitting: Urology

## 2021-12-19 DIAGNOSIS — I10 Essential (primary) hypertension: Secondary | ICD-10-CM

## 2021-12-23 NOTE — H&P (Signed)
I have bladder cancer that has been treated.  HPI: Austin Moses is a 66 year-old male established patient who is here for follow-up of bladder cancer treatment.    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 There was muscle in the specimen and no muscle invasion was noted. He has had no hematuria in the last 2 days but he has had some burning with urination. He have good continence.      ALLERGIES: Sulfa Drugs    MEDICATIONS: Aspirin 81 mg tablet,chewable  Lipitor 40 mg tablet  Amlodipine Besilate  Vitamin D3     GU PSH: Cystoscopy - 11/18/2021 Cystoscopy TURBT >5 cm - 11/29/2021 Prostate Needle Biopsy - 2017 Robotic Radical Prostatectomy - 2018       PSH Notes: Knee Surgery, Cataract Surgery   NON-GU PSH: Surgical Pathology, Gross And Microscopic Examination For Prostate Needle - 2017     GU PMH: Bladder tumor/neoplasm, HE has a large papillary neoplasm on the left bladder neck and lateral wall and the left UO is not well seen. There is an additional lesion on the floor of the bladder that is concerning for CIS. He needs a TURBT and may need left ureteral resection and stenting. I will do retrogrades and may do Gemcitabine if the tumor appears well resected and away from the orifice. I have reviewed the risks of bleeding, infection, bladder wall injury, possible need for secondary procedure, possible need for ureteral resection with the need for a stent. Possible ureteral and urethral strictures, chemical cystitis, secondary procedures, thrombotic events and anesthetic complications. HE will likely need BCG if the cancer is non-muscle invasive. - 11/18/2021 Gross hematuria, He had his initial bout of hematuria in November and a more recent severe recurrence. The bleeding is secondary to the bladder neoplasm. - 11/18/2021 Stress Incontinence - 2018, - 2018, - 2018, - 2018, - 2018, - 2018 ED due to arterial  insufficiency, SHIM is 11. - 2017 Primary hypogonadism - 2017, Hypogonadism, testicular, - 2015 Prostate Cancer, T1c Nx Mx Gleason 6 very low risk prostate cancer. - 2017 Elevated PSA - 2017, - 2017, Elevated prostate specific antigen (PSA), - 2015 Encounter for Prostate Cancer screening - 2017      PMH Notes:   1) Prostate cancer: He is s/p a BNS RAL radical prostatectomy on 08/07/16.   Diagnosis: pT2c Nx Mx, Gleason 3+4=7 adenocarcinoma with negative surgical margins  Pretreatment PSA: 6.67  Pretreatment SHIM: 2   NON-GU PMH: Arthritis Hypercholesterolemia Hypertension    FAMILY HISTORY: Acute Myocardial Infarction - Father Death of family member - Mother, Father malignant neoplasm of male breast - Mother   SOCIAL HISTORY: Marital Status: Married Preferred Language: English; Ethnicity: Not Hispanic Or Latino; Race: White Current Smoking Status: Patient has never smoked.  Does not use smokeless tobacco. Social Drinker.  Does not use drugs. Drinks 4+ caffeinated drinks per day.     Notes: Never a smoker, Occupation, Number of children, Alcohol Use, Marital History - Currently Married, Caffeine Use, Tobacco Use   REVIEW OF SYSTEMS:    GU Review Male:   Patient reports burning/ pain with urination and get up at night to urinate. Patient denies frequent urination, hard to postpone urination, leakage of urine, stream starts and stops, trouble starting your stream, have to strain to urinate , erection problems, and penile pain.  Gastrointestinal (Upper):   Patient denies nausea, vomiting, and indigestion/ heartburn.  Gastrointestinal (  Lower):   Patient denies diarrhea and constipation.  Constitutional:   Patient denies fever, night sweats, weight loss, and fatigue.  Skin:   Patient denies skin rash/ lesion and itching.  Eyes:   Patient denies blurred vision and double vision.  Ears/ Nose/ Throat:   Patient denies sore throat and sinus problems.  Hematologic/Lymphatic:   Patient  denies swollen glands and easy bruising.  Cardiovascular:   Patient denies leg swelling and chest pains.  Respiratory:   Patient denies cough and shortness of breath.  Endocrine:   Patient denies excessive thirst.  Musculoskeletal:   Patient denies back pain and joint pain.  Neurological:   Patient denies headaches and dizziness.  Psychologic:   Patient denies depression and anxiety.   VITAL SIGNS:      12/09/2021 01:49 PM  Weight 270 lb / 122.47 kg  Height 73 in / 185.42 cm  BP 133/84 mmHg  Heart Rate 73 /min  Temperature 98.2 F / 36.7 C  BMI 35.6 kg/m   MULTI-SYSTEM PHYSICAL EXAMINATION:    Constitutional: Well-nourished. No physical deformities. Normally developed. Good grooming.  Respiratory: Normal breath sounds. No labored breathing, no use of accessory muscles.   Cardiovascular: Regular rate and rhythm. No murmur, no gallop.      Complexity of Data:  Records Review:   Pathology Reports, Previous Patient Records  Urine Test Review:   Urinalysis   06/20/17 11/15/16 02/11/16 12/23/15 03/26/14  PSA  Total PSA <0.015 ng/mL < 0.015 ng/dl 6.67  6.001 ng/dl 4.20   Free PSA   0.81   0.61   % Free PSA   [id number]   15     03/25/14  Hormones  Testosterone, Total 267     PROCEDURES:          Urinalysis w/Scope Dipstick Dipstick Cont'd Micro  Color: Yellow Bilirubin: Neg mg/dL WBC/hpf: 0 - 5/hpf  Appearance: Clear Ketones: Neg mg/dL RBC/hpf: 10 - 20/hpf  Specific Gravity: 1.020 Blood: 3+ ery/uL Bacteria: Rare (0-9/hpf)  pH: <=5.0 Protein: 1+ mg/dL Cystals: NS (Not Seen)  Glucose: Neg mg/dL Urobilinogen: 0.2 mg/dL Casts: NS (Not Seen)    Nitrites: Neg Trichomonas: Not Present    Leukocyte Esterase: Neg leu/uL Mucous: Present      Epithelial Cells: NS (Not Seen)      Yeast: NS (Not Seen)      Sperm: Not Present    ASSESSMENT:      ICD-10 Details  1 GU:   Bladder Cancer overlapping sites - C67.8 Chronic, Threat to Bodily Function - He is scheduled to undergo a  restaging TURBT on 12/27/21. I reviewed the risks of bleeding, infection, bladder and ureteral injury, need for additional treatment including intravesical BCG or chemotherapy, thrombotic events and anesthetic complications.   I reviewed BCG therapy including the procedure and the side effects and possible need for additional therapies. I would start this in mid June after his post op f/u from the restaging resection.    PLAN:           Schedule Return Visit/Planned Activity: Keep Scheduled Appointment  Procedure: 12/27/2021 - Cystoscopy TURBT >5 cm - [id number]          Document Letter(s):  Created for Patient: Clinical Summary

## 2021-12-26 NOTE — Anesthesia Preprocedure Evaluation (Addendum)
Anesthesia Evaluation  Patient identified by MRN, date of birth, ID band Patient awake    Reviewed: Allergy & Precautions, NPO status , Patient's Chart, lab work & pertinent test results  Airway Mallampati: II  TM Distance: >3 FB Neck ROM: Full    Dental no notable dental hx. (+) Teeth Intact, Dental Advisory Given   Pulmonary    Pulmonary exam normal breath sounds clear to auscultation       Cardiovascular Exercise Tolerance: Good hypertension, + CAD  Normal cardiovascular exam+ Valvular Problems/Murmurs MR  Rhythm:Regular Rate:Normal  10/2019 TTE 1. LV global longitudinal strain is -20.7%. Left ventricular ejection  fraction, by estimation, is 60 to 65%. The left ventricle has normal  function. The left ventricle has no regional wall motion abnormalities.  The left ventricular internal cavity size  was mildly dilated. Left ventricular diastolic parameters were normal.  2. Right ventricular systolic function is normal. The right ventricular  size is normal. There is normal pulmonary artery systolic pressure.  3. The mitral valve is normal in structure. Mild mitral valve  regurgitation.  4. The aortic valve is abnormal. Aortic valve regurgitation is not  visualized. Mild aortic valve sclerosis is present, with no evidence of  aortic valve stenosis.    Neuro/Psych negative neurological ROS     GI/Hepatic Neg liver ROS,   Endo/Other  negative endocrine ROS  Renal/GU Lab Results      Component                Value               Date                      CREATININE               1.00                11/28/2021                K                        4.4                 11/28/2021                      Musculoskeletal  (+) Arthritis , Osteoarthritis,    Abdominal (+) + obese (BMI 35),   Peds  Hematology Lab Results      Component                Value               Date                      HGB                       13.5                11/28/2021                HCT                      40.7                11/28/2021                PLT  327                 11/28/2021              Anesthesia Other Findings   Reproductive/Obstetrics                            Anesthesia Physical Anesthesia Plan  ASA: 3  Anesthesia Plan: General   Post-op Pain Management: Lidocaine infusion*   Induction: Intravenous  PONV Risk Score and Plan: 3 and Treatment may vary due to age or medical condition, Midazolam, Dexamethasone and Ondansetron  Airway Management Planned: Oral ETT  Additional Equipment: None  Intra-op Plan:   Post-operative Plan: Extubation in OR  Informed Consent: I have reviewed the patients History and Physical, chart, labs and discussed the procedure including the risks, benefits and alternatives for the proposed anesthesia with the patient or authorized representative who has indicated his/her understanding and acceptance.     Dental advisory given  Plan Discussed with: CRNA and Anesthesiologist  Anesthesia Plan Comments:        Anesthesia Quick Evaluation

## 2021-12-27 ENCOUNTER — Ambulatory Visit (HOSPITAL_COMMUNITY): Payer: BC Managed Care – PPO | Admitting: Anesthesiology

## 2021-12-27 ENCOUNTER — Encounter (HOSPITAL_COMMUNITY)
Admission: RE | Disposition: A | Discharge status: Home / Self Care | Payer: Self-pay | Source: Home / Self Care | Attending: Urology

## 2021-12-27 ENCOUNTER — Encounter (HOSPITAL_COMMUNITY): Payer: Self-pay | Admitting: Urology

## 2021-12-27 ENCOUNTER — Ambulatory Visit (HOSPITAL_COMMUNITY)
Admission: RE | Admit: 2021-12-27 | Discharge: 2021-12-27 | Disposition: A | Discharge status: Home / Self Care | Payer: BC Managed Care – PPO | Attending: Urology | Admitting: Urology

## 2021-12-27 DIAGNOSIS — I340 Nonrheumatic mitral (valve) insufficiency: Secondary | ICD-10-CM | POA: Insufficient documentation

## 2021-12-27 DIAGNOSIS — C678 Malignant neoplasm of overlapping sites of bladder: Secondary | ICD-10-CM | POA: Diagnosis not present

## 2021-12-27 DIAGNOSIS — E669 Obesity, unspecified: Secondary | ICD-10-CM | POA: Diagnosis not present

## 2021-12-27 DIAGNOSIS — M1990 Unspecified osteoarthritis, unspecified site: Secondary | ICD-10-CM | POA: Diagnosis not present

## 2021-12-27 DIAGNOSIS — I2510 Atherosclerotic heart disease of native coronary artery without angina pectoris: Secondary | ICD-10-CM | POA: Insufficient documentation

## 2021-12-27 DIAGNOSIS — D303 Benign neoplasm of bladder: Secondary | ICD-10-CM | POA: Diagnosis not present

## 2021-12-27 DIAGNOSIS — Z6835 Body mass index (BMI) 35.0-35.9, adult: Secondary | ICD-10-CM | POA: Diagnosis not present

## 2021-12-27 DIAGNOSIS — N3090 Cystitis, unspecified without hematuria: Secondary | ICD-10-CM | POA: Diagnosis not present

## 2021-12-27 DIAGNOSIS — N3289 Other specified disorders of bladder: Secondary | ICD-10-CM | POA: Diagnosis not present

## 2021-12-27 DIAGNOSIS — N3020 Other chronic cystitis without hematuria: Secondary | ICD-10-CM | POA: Diagnosis not present

## 2021-12-27 DIAGNOSIS — I10 Essential (primary) hypertension: Secondary | ICD-10-CM | POA: Insufficient documentation

## 2021-12-27 DIAGNOSIS — C679 Malignant neoplasm of bladder, unspecified: Secondary | ICD-10-CM | POA: Diagnosis not present

## 2021-12-27 DIAGNOSIS — D414 Neoplasm of uncertain behavior of bladder: Secondary | ICD-10-CM | POA: Diagnosis not present

## 2021-12-27 HISTORY — PX: TRANSURETHRAL RESECTION OF BLADDER TUMOR: SHX2575

## 2021-12-27 LAB — CBC
HCT: 42.3 % (ref 39.0–52.0)
Hemoglobin: 14.3 g/dL (ref 13.0–17.0)
MCH: 28.4 pg (ref 26.0–34.0)
MCHC: 33.8 g/dL (ref 30.0–36.0)
MCV: 83.9 fL (ref 80.0–100.0)
Platelets: 326 10*3/uL (ref 150–400)
RBC: 5.04 MIL/uL (ref 4.22–5.81)
RDW: 13 % (ref 11.5–15.5)
WBC: 6.7 10*3/uL (ref 4.0–10.5)
nRBC: 0 % (ref 0.0–0.2)

## 2021-12-27 LAB — BASIC METABOLIC PANEL
Anion gap: 9 (ref 5–15)
BUN: 21 mg/dL (ref 8–23)
CO2: 23 mmol/L (ref 22–32)
Calcium: 9.1 mg/dL (ref 8.9–10.3)
Chloride: 108 mmol/L (ref 98–111)
Creatinine, Ser: 1.1 mg/dL (ref 0.61–1.24)
GFR, Estimated: >60 mL/min (ref >60)
Glucose, Bld: 112 mg/dL — ABNORMAL HIGH (ref 70–99)
Potassium: 4.4 mmol/L (ref 3.5–5.1)
Sodium: 140 mmol/L (ref 135–145)

## 2021-12-27 SURGERY — TURBT (TRANSURETHRAL RESECTION OF BLADDER TUMOR)
Anesthesia: General

## 2021-12-27 MED ORDER — LIDOCAINE 2% (20 MG/ML) 5 ML SYRINGE
INTRAMUSCULAR | Status: DC | PRN
  Administered 2021-12-27: 100 mg via INTRAVENOUS

## 2021-12-27 MED ORDER — OXYCODONE HCL 5 MG PO TABS
5.0000 mg | ORAL_TABLET | Freq: Once | ORAL | Status: AC | PRN
  Administered 2021-12-27: 5 mg via ORAL

## 2021-12-27 MED ORDER — ONDANSETRON HCL 4 MG/2ML IJ SOLN: 4.0000 mg | Freq: Once | INTRAMUSCULAR | Status: DC | PRN

## 2021-12-27 MED ORDER — SUGAMMADEX SODIUM 500 MG/5ML IV SOLN
INTRAVENOUS | Status: DC | PRN
  Administered 2021-12-27: 300 mg via INTRAVENOUS

## 2021-12-27 MED ORDER — 0.9 % SODIUM CHLORIDE (POUR BTL) OPTIME
TOPICAL | Status: DC | PRN
  Administered 2021-12-27: 1000 mL

## 2021-12-27 MED ORDER — HYDROMORPHONE HCL 1 MG/ML IJ SOLN
INTRAMUSCULAR | Status: AC
  Filled 2021-12-27: qty 1

## 2021-12-27 MED ORDER — DEXAMETHASONE SODIUM PHOSPHATE 10 MG/ML IJ SOLN
INTRAMUSCULAR | Status: DC | PRN
  Administered 2021-12-27: 8 mg via INTRAVENOUS

## 2021-12-27 MED ORDER — OXYCODONE HCL 5 MG PO TABS
ORAL_TABLET | ORAL | Status: AC
  Filled 2021-12-27: qty 1

## 2021-12-27 MED ORDER — SUGAMMADEX SODIUM 500 MG/5ML IV SOLN
INTRAVENOUS | Status: AC
  Filled 2021-12-27: qty 5

## 2021-12-27 MED ORDER — FENTANYL CITRATE (PF) 100 MCG/2ML IJ SOLN
INTRAMUSCULAR | Status: DC | PRN
  Administered 2021-12-27 (×2): 50 ug via INTRAVENOUS
  Administered 2021-12-27: 100 ug via INTRAVENOUS

## 2021-12-27 MED ORDER — FENTANYL CITRATE (PF) 100 MCG/2ML IJ SOLN
INTRAMUSCULAR | Status: AC
  Filled 2021-12-27: qty 2

## 2021-12-27 MED ORDER — HYDROCODONE-ACETAMINOPHEN 5-325 MG PO TABS: 1.0000 | ORAL_TABLET | Freq: Four times a day (QID) | ORAL | 0 refills | Status: DC | PRN

## 2021-12-27 MED ORDER — CEFAZOLIN SODIUM-DEXTROSE 2-4 GM/100ML-% IV SOLN: 2.0000 g | INTRAVENOUS | Status: DC

## 2021-12-27 MED ORDER — HYDROMORPHONE HCL 1 MG/ML IJ SOLN
0.2500 mg | INTRAMUSCULAR | Status: DC | PRN
  Administered 2021-12-27 (×3): 0.5 mg via INTRAVENOUS

## 2021-12-27 MED ORDER — CHLORHEXIDINE GLUCONATE 0.12 % MT SOLN
15.0000 mL | Freq: Once | OROMUCOSAL | Status: AC
  Administered 2021-12-27: 15 mL via OROMUCOSAL

## 2021-12-27 MED ORDER — ROCURONIUM BROMIDE 10 MG/ML (PF) SYRINGE
PREFILLED_SYRINGE | INTRAVENOUS | Status: AC
  Filled 2021-12-27: qty 10

## 2021-12-27 MED ORDER — CEFAZOLIN IN SODIUM CHLORIDE 3-0.9 GM/100ML-% IV SOLN
3.0000 g | Freq: Once | INTRAVENOUS | Status: AC
  Administered 2021-12-27: 3 g via INTRAVENOUS
  Filled 2021-12-27: qty 100

## 2021-12-27 MED ORDER — LIDOCAINE HCL (PF) 2 % IJ SOLN
INTRAMUSCULAR | Status: AC
  Filled 2021-12-27: qty 20

## 2021-12-27 MED ORDER — SODIUM CHLORIDE 0.9 % IR SOLN
Status: DC | PRN
  Administered 2021-12-27: 9000 mL

## 2021-12-27 MED ORDER — ORAL CARE MOUTH RINSE: 15.0000 mL | Freq: Once | OROMUCOSAL | Status: AC

## 2021-12-27 MED ORDER — OXYCODONE HCL 5 MG/5ML PO SOLN: 5.0000 mg | Freq: Once | ORAL | Status: AC | PRN

## 2021-12-27 MED ORDER — MIDAZOLAM HCL 2 MG/2ML IJ SOLN
INTRAMUSCULAR | Status: AC
  Filled 2021-12-27: qty 2

## 2021-12-27 MED ORDER — MIDAZOLAM HCL 5 MG/5ML IJ SOLN
INTRAMUSCULAR | Status: DC | PRN
  Administered 2021-12-27: 2 mg via INTRAVENOUS

## 2021-12-27 MED ORDER — AMISULPRIDE (ANTIEMETIC) 5 MG/2ML IV SOLN: 10.0000 mg | Freq: Once | INTRAVENOUS | Status: DC | PRN

## 2021-12-27 MED ORDER — ROCURONIUM BROMIDE 10 MG/ML (PF) SYRINGE
PREFILLED_SYRINGE | INTRAVENOUS | Status: DC | PRN
  Administered 2021-12-27: 70 mg via INTRAVENOUS
  Administered 2021-12-27: 20 mg via INTRAVENOUS

## 2021-12-27 MED ORDER — PROPOFOL 10 MG/ML IV BOLUS
INTRAVENOUS | Status: DC | PRN
  Administered 2021-12-27: 170 mg via INTRAVENOUS

## 2021-12-27 MED ORDER — DEXAMETHASONE SODIUM PHOSPHATE 10 MG/ML IJ SOLN
INTRAMUSCULAR | Status: AC
  Filled 2021-12-27: qty 4

## 2021-12-27 MED ORDER — ONDANSETRON HCL 4 MG/2ML IJ SOLN
INTRAMUSCULAR | Status: AC
  Filled 2021-12-27: qty 8

## 2021-12-27 MED ORDER — PROPOFOL 10 MG/ML IV BOLUS
INTRAVENOUS | Status: AC
  Filled 2021-12-27: qty 20

## 2021-12-27 MED ORDER — LACTATED RINGERS IV SOLN: INTRAVENOUS | Status: DC

## 2021-12-27 MED ORDER — DEXMEDETOMIDINE (PRECEDEX) IN NS 20 MCG/5ML (4 MCG/ML) IV SYRINGE
PREFILLED_SYRINGE | INTRAVENOUS | Status: DC | PRN
  Administered 2021-12-27: 12 ug via INTRAVENOUS
  Administered 2021-12-27: 8 ug via INTRAVENOUS

## 2021-12-27 MED ORDER — STERILE WATER FOR IRRIGATION IR SOLN
Status: DC | PRN
  Administered 2021-12-27: 500 mL

## 2021-12-27 MED ORDER — ACETAMINOPHEN 10 MG/ML IV SOLN
1000.0000 mg | Freq: Once | INTRAVENOUS | Status: DC | PRN
  Administered 2021-12-27: 1000 mg via INTRAVENOUS

## 2021-12-27 MED ORDER — ACETAMINOPHEN 10 MG/ML IV SOLN
INTRAVENOUS | Status: AC
  Filled 2021-12-27: qty 100

## 2021-12-27 MED ORDER — ONDANSETRON HCL 4 MG/2ML IJ SOLN
INTRAMUSCULAR | Status: DC | PRN
  Administered 2021-12-27: 4 mg via INTRAVENOUS

## 2021-12-27 MED ORDER — SODIUM CHLORIDE 0.9% FLUSH: 3.0000 mL | Freq: Two times a day (BID) | INTRAVENOUS | Status: DC

## 2021-12-27 SURGICAL SUPPLY — 26 items
BAG COUNTER SPONGE SURGICOUNT (BAG) ×2 IMPLANT
BAG DRN RND TRDRP ANRFLXCHMBR (UROLOGICAL SUPPLIES) ×1
BAG SPNG CNTER NS LX DISP (BAG)
BAG URINE DRAIN 2000ML AR STRL (UROLOGICAL SUPPLIES) ×1 IMPLANT
BAG URO CATCHER STRL LF (MISCELLANEOUS) ×3 IMPLANT
CATH FOLEY 2WAY SLVR  5CC 20FR (CATHETERS) ×2
CATH FOLEY 2WAY SLVR 5CC 20FR (CATHETERS) IMPLANT
CATH FOLEY 3WAY 30CC 22FR (CATHETERS) IMPLANT
CATH URETL OPEN 5X70 (CATHETERS) IMPLANT
DRAPE FOOT SWITCH (DRAPES) ×3 IMPLANT
ELECT REM PT RETURN 15FT ADLT (MISCELLANEOUS) ×2 IMPLANT
GLOVE SURG SS PI 8.0 STRL IVOR (GLOVE) ×1 IMPLANT
GOWN STRL REUS W/ TWL XL LVL3 (GOWN DISPOSABLE) ×2 IMPLANT
GOWN STRL REUS W/TWL XL LVL3 (GOWN DISPOSABLE) ×2
HOLDER FOLEY CATH W/STRAP (MISCELLANEOUS) ×1 IMPLANT
KIT TURNOVER KIT A (KITS) ×3 IMPLANT
LOOP CUT BIPOLAR 24F LRG (ELECTROSURGICAL) ×1 IMPLANT
MANIFOLD NEPTUNE II (INSTRUMENTS) ×3 IMPLANT
PACK CYSTO (CUSTOM PROCEDURE TRAY) ×3 IMPLANT
SET IRRIG Y TYPE TUR BLADDER L (SET/KITS/TRAYS/PACK) ×2 IMPLANT
SUT ETHILON 3 0 PS 1 (SUTURE) IMPLANT
SYR 30ML LL (SYRINGE) IMPLANT
SYR TOOMEY IRRIG 70ML (MISCELLANEOUS)
SYRINGE TOOMEY IRRIG 70ML (MISCELLANEOUS) IMPLANT
TUBING CONNECTING 10 (TUBING) ×3 IMPLANT
TUBING UROLOGY SET (TUBING) ×3 IMPLANT

## 2021-12-27 NOTE — Anesthesia Procedure Notes (Signed)
Procedure Name: Intubation Date/Time: 12/27/2021 7:34 AM Performed by: Lavina Hamman, CRNA Pre-anesthesia Checklist: Patient identified, Emergency Drugs available, Suction available, Patient being monitored and Timeout performed Patient Re-evaluated:Patient Re-evaluated prior to induction Oxygen Delivery Method: Circle system utilized Preoxygenation: Pre-oxygenation with 100% oxygen Induction Type: IV induction Ventilation: Mask ventilation without difficulty Laryngoscope Size: Mac and 4 Grade View: Grade II Tube type: Oral Tube size: 7.5 mm Number of attempts: 1 Airway Equipment and Method: Stylet Placement Confirmation: ETT inserted through vocal cords under direct vision, positive ETCO2, CO2 detector and breath sounds checked- equal and bilateral Secured at: 22 cm Tube secured with: Tape Dental Injury: Teeth and Oropharynx as per pre-operative assessment  Comments: ATOI

## 2021-12-27 NOTE — Interval H&P Note (Signed)
History and Physical Interval Note:  12/27/2021 7:13 AM  Austin Moses  has presented today for surgery, with the diagnosis of BLADDER CANCER.  The various methods of treatment have been discussed with the patient and family. After consideration of risks, benefits and other options for treatment, the patient has consented to  Procedure(s) with comments: TRANSURETHRAL RESECTION OF BLADDER TUMOR (TURBT) (N/A) - 1 HR FOR CASE as a surgical intervention.  The patient's history has been reviewed, patient examined, no change in status, stable for surgery.  I have reviewed the patient's chart and labs.  Questions were answered to the patient's satisfaction.     Irine Seal

## 2021-12-27 NOTE — Op Note (Signed)
Procedure: Restaging TUR BT greater than 5 cm tumor.  Preop diagnosis: Large T1 high-grade nonmuscle invasive bladder cancer of overlapping sites.  Postop diagnosis: Same.  Surgeon: Dr. Irine Seal.  Anesthesia: General.  Specimen: Resection chips.  Drains: 20 French 10 cc Foley.  EBL: None.  Complications: None.  Indications: The patient is a 66 year old male who recently underwent resection of a large bladder tumor involving the left bladder neck left lateral wall anterior wall and midline floor of the bladder that was found to be a T1 high-grade nonmuscle invasive tumor and he returns today for routine restaging TURBT.  Procedure: He was taken operating room was given 3 g of Ancef.  A general anesthetic was induced.  He was intubated and paralyzed.  He was placed in lithotomy position and fitted with PAS hose.  His perineum and genitalia were prepped with Betadine solution he was draped in usual sterile fashion.  The 21 French cystoscope with 30 degree lens was passed without difficulty.  Inspection revealed a normal urethra.  The external sphincter was intact.  The prostate had been surgically removed.  There was a large resection bed extending from the mid posterior prostate up onto the left lateral wall and bladder neck extending anteriorly to the 12:00.  No obvious residual tumor tissue was apparent but there was some edema around the margins of the resection bed which was covered by fibrinous exudate.  Initially the left ureteral orifice could not be identified.  It initial resection it was quite close to the margin of the tumor.  The right ureteral orifice was unremarkable.  No additional lesions were noted on the remaining bladder mucosa.  The cystoscope was replaced with the 26 French continuous-flow resectoscope sheath which was passed with the aid of visual obturator.  The visual obturator was replaced with an Beatrix Fetters handle with a 30 degree lens and bipolar loop.  Saline was  used the irrigant.  The previous resection bed was then read resected down to muscle with an additional margin taken where possible on the edges.  After resecting the area around the presumed location of the left ureteral orifice, it was eventually identified and avoided.  The prior lesion was completely we resected and the resection was carried down into the muscle.  No bladder wall perforation was noted.  Once the resection was complete, final hemostasis was achieved and the bladder was evacuated free of all chips.  The bladder was drained and then refilled inspection demonstrated no active bleeding and an intact left ureteral orifice.  The right ureteral orifice was well away from the resection site.  The scope was removed and a 20 [place] Foley catheter was inserted.  The balloon was filled with 10 mL of sterile fluid.  Catheter was irrigated with clear return and placed to straight drainage.  He was taken down from lithotomy position, his anesthetic was reversed and he was moved to recovery in stable condition.  There were no complications.

## 2021-12-27 NOTE — Anesthesia Postprocedure Evaluation (Signed)
Anesthesia Post Note  Patient: Austin Moses  Procedure(s) Performed: TRANSURETHRAL RESECTION OF BLADDER TUMOR (TURBT)     Patient location during evaluation: PACU Anesthesia Type: General Level of consciousness: awake and alert Pain management: pain level controlled Vital Signs Assessment: post-procedure vital signs reviewed and stable Respiratory status: spontaneous breathing, nonlabored ventilation, respiratory function stable and patient connected to nasal cannula oxygen Cardiovascular status: blood pressure returned to baseline and stable Postop Assessment: no apparent nausea or vomiting Anesthetic complications: no   No notable events documented.  Last Vitals:  Vitals:   12/27/21 0945 12/27/21 1000  BP: 132/84 135/85  Pulse: 65 63  Resp: 18 18  Temp:    SpO2: 94% 95%    Last Pain:  Vitals:   12/27/21 1000  TempSrc:   PainSc: 7                  Barnet Glasgow

## 2021-12-27 NOTE — Transfer of Care (Signed)
Immediate Anesthesia Transfer of Care Note  Patient: Austin Moses  Procedure(s) Performed: Procedure(s) with comments: TRANSURETHRAL RESECTION OF BLADDER TUMOR (TURBT) (N/A) - 1 HR FOR CASE  Patient Location: PACU  Anesthesia Type:General  Level of Consciousness:  sedated, patient cooperative and responds to stimulation  Airway & Oxygen Therapy:Patient Spontanous Breathing and Patient connected to face mask oxgen  Post-op Assessment:  Report given to PACU RN and Post -op Vital signs reviewed and stable  Post vital signs:  Reviewed and stable  Last Vitals:  Vitals:   12/27/21 0537  BP: (!) 148/90  Pulse: 63  Resp: 15  Temp: 36.6 C  SpO2: 12%    Complications: No apparent anesthesia complications

## 2021-12-28 ENCOUNTER — Encounter (HOSPITAL_COMMUNITY): Payer: Self-pay | Admitting: Urology

## 2021-12-28 LAB — SURGICAL PATHOLOGY

## 2022-01-06 DIAGNOSIS — C678 Malignant neoplasm of overlapping sites of bladder: Secondary | ICD-10-CM | POA: Diagnosis not present

## 2022-01-20 DIAGNOSIS — C678 Malignant neoplasm of overlapping sites of bladder: Secondary | ICD-10-CM | POA: Diagnosis not present

## 2022-01-20 DIAGNOSIS — Z5111 Encounter for antineoplastic chemotherapy: Secondary | ICD-10-CM | POA: Diagnosis not present

## 2022-01-27 DIAGNOSIS — D414 Neoplasm of uncertain behavior of bladder: Secondary | ICD-10-CM | POA: Diagnosis not present

## 2022-01-27 DIAGNOSIS — Z5111 Encounter for antineoplastic chemotherapy: Secondary | ICD-10-CM | POA: Diagnosis not present

## 2022-02-03 DIAGNOSIS — Z5111 Encounter for antineoplastic chemotherapy: Secondary | ICD-10-CM | POA: Diagnosis not present

## 2022-02-03 DIAGNOSIS — C678 Malignant neoplasm of overlapping sites of bladder: Secondary | ICD-10-CM | POA: Diagnosis not present

## 2022-02-05 ENCOUNTER — Other Ambulatory Visit: Payer: Self-pay | Admitting: Cardiology

## 2022-02-10 DIAGNOSIS — C678 Malignant neoplasm of overlapping sites of bladder: Secondary | ICD-10-CM | POA: Diagnosis not present

## 2022-02-10 DIAGNOSIS — Z5111 Encounter for antineoplastic chemotherapy: Secondary | ICD-10-CM | POA: Diagnosis not present

## 2022-02-17 DIAGNOSIS — Z5111 Encounter for antineoplastic chemotherapy: Secondary | ICD-10-CM | POA: Diagnosis not present

## 2022-02-17 DIAGNOSIS — C678 Malignant neoplasm of overlapping sites of bladder: Secondary | ICD-10-CM | POA: Diagnosis not present

## 2022-02-21 ENCOUNTER — Ambulatory Visit: Payer: BC Managed Care – PPO | Admitting: Cardiology

## 2022-02-24 DIAGNOSIS — Z5111 Encounter for antineoplastic chemotherapy: Secondary | ICD-10-CM | POA: Diagnosis not present

## 2022-02-24 DIAGNOSIS — C678 Malignant neoplasm of overlapping sites of bladder: Secondary | ICD-10-CM | POA: Diagnosis not present

## 2022-03-10 ENCOUNTER — Other Ambulatory Visit: Payer: Self-pay | Admitting: Cardiology

## 2022-04-13 ENCOUNTER — Ambulatory Visit: Payer: BC Managed Care – PPO | Attending: Cardiology | Admitting: Cardiology

## 2022-04-13 VITALS — BP 138/88 | HR 65 | Ht 73.0 in | Wt 284.5 lb

## 2022-04-13 DIAGNOSIS — E7800 Pure hypercholesterolemia, unspecified: Secondary | ICD-10-CM | POA: Diagnosis not present

## 2022-04-13 DIAGNOSIS — I700 Atherosclerosis of aorta: Secondary | ICD-10-CM | POA: Diagnosis not present

## 2022-04-13 DIAGNOSIS — I2510 Atherosclerotic heart disease of native coronary artery without angina pectoris: Secondary | ICD-10-CM | POA: Diagnosis not present

## 2022-04-13 MED ORDER — ROSUVASTATIN CALCIUM 40 MG PO TABS: ORAL_TABLET | ORAL | 3 refills | Status: DC

## 2022-04-13 MED ORDER — EZETIMIBE 10 MG PO TABS: ORAL_TABLET | ORAL | 3 refills | Status: DC

## 2022-04-13 MED ORDER — AMLODIPINE BESY-BENAZEPRIL HCL 5-10 MG PO CAPS: ORAL_CAPSULE | ORAL | 3 refills | Status: AC

## 2022-04-13 NOTE — Progress Notes (Signed)
Cardiology Office Note:    Date:  04/13/2022   ID:  Austin Moses, DOB 06-Oct-1955, MRN [id number]  PCP:  Reynold Bowen, MD   Fellows Providers Cardiologist:  Candee Furbish, MD     Referring MD: Reynold Bowen, MD    History of Present Illness:    Austin Moses is a 66 y.o. male here for the follow-up of coronary artery disease.  Cath 11/10/19: Mild to moderate diffuse three-vessel atherosclerosis/luminal irregularity and calcification.  No obstructive disease is noted. Widely patent left main 30% mid LAD.  LAD wraps around the left ventricular apex. Codominant circumflex with no significant obstruction Codominant right coronary with no significant obstruction. Overall normal LV function.  EF 50 to 60%.  LVEDP is normal.   RECOMMENDATIONS:   Aggressive risk factor modification. Diagnostic Dominance: Co-dominant      ECHO 11/28/19:   1. LV global longitudinal strain is -20.7%. Left ventricular ejection  fraction, by estimation, is 60 to 65%. The left ventricle has normal  function. The left ventricle has no regional wall motion abnormalities.  The left ventricular internal cavity size  was mildly dilated. Left ventricular diastolic parameters were normal.   2. Right ventricular systolic function is normal. The right ventricular  size is normal. There is normal pulmonary artery systolic pressure.   3. The mitral valve is normal in structure. Mild mitral valve  regurgitation.   4. The aortic valve is abnormal. Aortic valve regurgitation is not  visualized. Mild aortic valve sclerosis is present, with no evidence of  aortic valve stenosis.    request of Dr. Carrolyn Meiers.   Coronary calcium score on 10/02/2019: 1. Patient's total coronary artery calcium score is 1,958 which is 99th percentile for patient's of matched age, gender and race/ethnicity. Please note that although the presence of coronary artery calcium documents the presence of coronary artery  disease, the severity of this disease and any potential stenosis cannot be assessed on this noncontrast CT examination. Assessment for potential risk factor modification, dietary therapy or pharmacologic therapy may be warranted, if clinically indicated. 2.  Aortic Atherosclerosis (ICD10-I70.0).   Prior note: Non-smoker has recently been trying to lose weight cutting out alcohol wheat and sugar.  Feels better.  Has been on atorvastatin 20 mg for hyperlipidemia.   He has had occasional chest discomfort.  He does describe some left sided fairly localized chest discomfort, questions whether or not this is from doing push-ups previously.  He also occasionally will have back pain as well.  He sometimes worries about this because his father had back pain prior to his heart attack that killed him.   His father had coronary artery disease died at age 33.   He played football at [employer], ran track.  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   Also had a history of robotic prostatectomy.  Prostate cancer.Had bladder cancer. Hematuria. ER [place]. Saw Dr. Jeffie Pollock here. Tumor in bladder. 6 treatment BCG.   LDL 40 in May 2023. Zetia Crestor.   Past Medical History:  Diagnosis Date   Arthritis    Cancer [facility])    prostate cancer   Cancer of prostate ([place])    Cataract    Hyperlipidemia    Hypertension    Impaired fasting blood sugar    Joint pain    Low testosterone in male    Obesity    Protrusion of lumbar intervertebral disc  L3-L4, L5-S1   Vitamin D deficiency     Past Surgical History:  Procedure Laterality Date   APPENDECTOMY     COLONOSCOPY  12/14/2010   Normal   cotisone injection  05/31/2016   every 3-4 months in left knee   EYE SURGERY     bilateral cataract surgery with lens implants   KNEE ARTHROPLASTY Left    KNEE ARTHROSCOPY     dr supple- left knee   LEFT HEART CATH AND CORONARY ANGIOGRAPHY N/A  11/10/2019   Procedure: LEFT HEART CATH AND CORONARY ANGIOGRAPHY;  Surgeon: Belva Crome, MD;  Location: [place] [facility];  Service: Cardiovascular;  Laterality: N/A;   PROSTATE BIOPSY     ROBOT ASSISTED LAPAROSCOPIC RADICAL PROSTATECTOMY N/A 08/07/2016   Procedure: ROBOTIC ASSISTED LAPAROSCOPIC RADICAL PROSTATECTOMY LEVEL 1;  Surgeon: Raynelle Bring, MD;  Location: [facility];  Service: Urology;  Laterality: N/A;   TRANSURETHRAL RESECTION OF BLADDER TUMOR N/A 12/27/2021   Procedure: TRANSURETHRAL RESECTION OF BLADDER TUMOR (TURBT);  Surgeon: Irine Seal, MD;  Location: [facility];  Service: Urology;  Laterality: N/A;  1 HR FOR CASE   TRANSURETHRAL RESECTION OF BLADDER TUMOR WITH MITOMYCIN-C Bilateral 11/29/2021   Procedure: CYSTOSCOPY BILATERAL RETROGRADES TRANSURETHRAL RESECTION OF BLADDER TUMOR;  Surgeon: Irine Seal, MD;  Location: [facility];  Service: Urology;  Laterality: Bilateral;    Current Medications: Current Meds  Medication Sig   Vitamin D, Ergocalciferol, (DRISDOL) 1.25 MG (50000 UNIT) CAPS capsule Take 50,000 Units by mouth every Thursday.   [DISCONTINUED] amLODipine-benazepril (LOTREL) 5-10 MG per capsule take 1 capsule by mouth once daily   [DISCONTINUED] ezetimibe (ZETIA) 10 MG tablet TAKE 1 TABLET(10 MG) BY MOUTH DAILY   [DISCONTINUED] rosuvastatin (CRESTOR) 40 MG tablet TAKE 1 TABLET(40 MG) BY MOUTH DAILY     Allergies:   Sulfa antibiotics   Social History   Socioeconomic History   Marital status: Married    Spouse name: Not on file   Number of children: Not on file   Years of education: Not on file   Highest education level: Not on file  Occupational History   Not on file  Tobacco Use   Smoking status: Never   Smokeless tobacco: Never  Vaping Use   Vaping Use: Never used  Substance and Sexual Activity   Alcohol use: Yes    Alcohol/week: 5.0 standard drinks of alcohol    Types: 5 Cans of beer per week    Comment: not as much currently   Drug use: Yes    Types:  Marijuana    Comment: once every few months   Sexual activity: Yes  Other Topics Concern   Not on file  Social History Narrative   Not on file   Social Determinants of Health   Financial Resource Strain: Not on file  Food Insecurity: Not on file  Transportation Needs: Not on file  Physical Activity: Not on file  Stress: Not on file  Social Connections: Not on file     Family History: The patient's family history includes Arthritis in his maternal grandfather; Breast cancer in his mother; Colon polyps in his father; Dementia in his mother; Depression in his sister; Heart disease in his father; Hyperlipidemia in his father, mother, and sister; Hypertension in his father, mother, and sister.  ROS:   Please see the history of present illness.     All other systems reviewed and are negative.  EKGs/Labs/Other Studies Reviewed:    The following studies were reviewed today: As  above  EKG:  EKG is  ordered today.  The ekg ordered today demonstrates NSR 65  Recent Labs: 12/27/2021: BUN 21; Creatinine, Ser 1.10; Hemoglobin 14.3; Platelets 326; Potassium 4.4; Sodium 140  Recent Lipid Panel    Component Value Date/Time   CHOL 149 05/31/2020 0850   TRIG 90 05/31/2020 0850   HDL 42 05/31/2020 0850   CHOLHDL 3.5 05/31/2020 0850   CHOLHDL 4 09/16/2012 0858   VLDL 33.0 09/16/2012 0858   LDLCALC 90 05/31/2020 0850     Risk Assessment/Calculations:              Physical Exam:    VS:  BP 138/88 (BP Location: Left Arm, Patient Position: Sitting, Cuff Size: Large)   Pulse 65   Ht '6\' 1"'$  (1.854 m)   Wt 284 lb 8 oz (129 kg)   BMI 37.54 kg/m     Wt Readings from Last 3 Encounters:  04/13/22 284 lb 8 oz (129 kg)  12/27/21 272 lb 14.9 oz (123.8 kg)  11/29/21 270 lb 3.2 oz (122.6 kg)     GEN:  Well nourished, well developed in no acute distress HEENT: Normal NECK: No JVD; No carotid bruits LYMPHATICS: No lymphadenopathy CARDIAC: RRR, no murmurs, no rubs,  gallops RESPIRATORY:  Clear to auscultation without rales, wheezing or rhonchi  ABDOMEN: Soft, non-tender, non-distended MUSCULOSKELETAL:  No edema; No deformity  SKIN: Warm and dry NEUROLOGIC:  Alert and oriented x 3 PSYCHIATRIC:  Normal affect   ASSESSMENT:    1. Coronary artery disease involving native coronary artery of native heart without angina pectoris   2. Pure hypercholesterolemia   3. Aortic atherosclerosis (HCC)    PLAN:    In order of problems listed above:  Coronary artery disease/coronary artery calcification/atherosclerosis -Minimal luminal irregularities on cardiac catheterization performed in April 2021.  He was quite surprised.  Continue to promote Mediterranean type diet.-Continue with aspirin 81 mg. -Continue with high intensity statin Crestor 40 as well as Zetia 10.  Last LDL 40.  Excellent    Aortic atherosclerosis -Intensifed statin therapy.  Given his dense coronary calcification and concomitant aortic atherosclerosis.  No prior stroke history.   Hyperlipidemia - Excellent results as above.  Family history of CAD -Father died suddenly in his late 99s from MI.  Risk factor prevention.     Medication Adjustments/Labs and Tests Ordered: Current medicines are reviewed at length with the patient today.  Concerns regarding medicines are outlined above.  No orders of the defined types were placed in this encounter.  Meds ordered this encounter  Medications   rosuvastatin (CRESTOR) 40 MG tablet    Sig: TAKE 1 TABLET(40 MG) BY MOUTH DAILY    Dispense:  90 tablet    Refill:  3   ezetimibe (ZETIA) 10 MG tablet    Sig: TAKE 1 TABLET(10 MG) BY MOUTH DAILY    Dispense:  90 tablet    Refill:  3   amLODipine-benazepril (LOTREL) 5-10 MG capsule    Sig: take 1 capsule by mouth once daily    Dispense:  90 capsule    Refill:  3    Patient Instructions  Medication Instructions:  The current medical regimen is effective;  continue present plan and  medications.  *If you need a refill on your cardiac medications before your next appointment, please call your pharmacy*  Follow-Up: At [facility], you and your health needs are our priority.  As part of our continuing mission to provide you with exceptional  heart care, we have created designated Provider Care Teams.  These Care Teams include your primary Cardiologist (physician) and Advanced Practice Providers (APPs -  Physician Assistants and Nurse Practitioners) who all work together to provide you with the care you need, when you need it.  We recommend signing up for the patient portal called "MyChart".  Sign up information is provided on this After Visit Summary.  MyChart is used to connect with patients for Virtual Visits (Telemedicine).  Patients are able to view lab/test results, encounter notes, upcoming appointments, etc.  Non-urgent messages can be sent to your provider as well.   To learn more about what you can do with MyChart, go to [email or web address].    Your next appointment:   1 year(s)  The format for your next appointment:   In Person  Provider:   Candee Furbish, MD       Important Information About Sugar         Signed, Candee Furbish, MD  04/13/2022 9:41 AM    [place]

## 2022-04-13 NOTE — Patient Instructions (Signed)
Medication Instructions:  The current medical regimen is effective;  continue present plan and medications.  *If you need a refill on your cardiac medications before your next appointment, please call your pharmacy*  Follow-Up: At [facility], you and your health needs are our priority.  As part of our continuing mission to provide you with exceptional heart care, we have created designated Provider Care Teams.  These Care Teams include your primary Cardiologist (physician) and Advanced Practice Providers (APPs -  Physician Assistants and Nurse Practitioners) who all work together to provide you with the care you need, when you need it.  We recommend signing up for the patient portal called "MyChart".  Sign up information is provided on this After Visit Summary.  MyChart is used to connect with patients for Virtual Visits (Telemedicine).  Patients are able to view lab/test results, encounter notes, upcoming appointments, etc.  Non-urgent messages can be sent to your provider as well.   To learn more about what you can do with MyChart, go to [email or web address].    Your next appointment:   1 year(s)  The format for your next appointment:   In Person  Provider:   Mark Skains, MD      Important Information About Sugar       

## 2022-04-14 NOTE — Addendum Note (Signed)
Addended by: Shellia Cleverly on: 04/14/2022 03:08 PM   Modules accepted: Orders

## 2022-04-24 DIAGNOSIS — Z8551 Personal history of malignant neoplasm of bladder: Secondary | ICD-10-CM | POA: Diagnosis not present

## 2022-05-04 DIAGNOSIS — C678 Malignant neoplasm of overlapping sites of bladder: Secondary | ICD-10-CM | POA: Diagnosis not present

## 2022-05-04 DIAGNOSIS — Z5111 Encounter for antineoplastic chemotherapy: Secondary | ICD-10-CM | POA: Diagnosis not present

## 2022-05-11 DIAGNOSIS — Z5111 Encounter for antineoplastic chemotherapy: Secondary | ICD-10-CM | POA: Diagnosis not present

## 2022-05-11 DIAGNOSIS — C678 Malignant neoplasm of overlapping sites of bladder: Secondary | ICD-10-CM | POA: Diagnosis not present

## 2022-05-18 DIAGNOSIS — C678 Malignant neoplasm of overlapping sites of bladder: Secondary | ICD-10-CM | POA: Diagnosis not present

## 2022-05-18 DIAGNOSIS — Z5111 Encounter for antineoplastic chemotherapy: Secondary | ICD-10-CM | POA: Diagnosis not present

## 2022-05-22 ENCOUNTER — Other Ambulatory Visit: Payer: Self-pay | Admitting: Cardiology

## 2022-07-04 DIAGNOSIS — I10 Essential (primary) hypertension: Secondary | ICD-10-CM | POA: Diagnosis not present

## 2022-07-04 DIAGNOSIS — Z23 Encounter for immunization: Secondary | ICD-10-CM | POA: Diagnosis not present

## 2022-07-04 DIAGNOSIS — R7301 Impaired fasting glucose: Secondary | ICD-10-CM | POA: Diagnosis not present

## 2022-07-04 DIAGNOSIS — C61 Malignant neoplasm of prostate: Secondary | ICD-10-CM | POA: Diagnosis not present

## 2022-07-26 DIAGNOSIS — Z8551 Personal history of malignant neoplasm of bladder: Secondary | ICD-10-CM | POA: Diagnosis not present

## 2022-07-26 DIAGNOSIS — Z8546 Personal history of malignant neoplasm of prostate: Secondary | ICD-10-CM | POA: Diagnosis not present

## 2022-09-22 ENCOUNTER — Other Ambulatory Visit: Payer: Self-pay | Admitting: Cardiology

## 2022-10-25 DIAGNOSIS — Z8551 Personal history of malignant neoplasm of bladder: Secondary | ICD-10-CM | POA: Diagnosis not present

## 2022-12-13 DIAGNOSIS — C678 Malignant neoplasm of overlapping sites of bladder: Secondary | ICD-10-CM | POA: Diagnosis not present

## 2022-12-13 DIAGNOSIS — Z5111 Encounter for antineoplastic chemotherapy: Secondary | ICD-10-CM | POA: Diagnosis not present

## 2022-12-20 DIAGNOSIS — Z5111 Encounter for antineoplastic chemotherapy: Secondary | ICD-10-CM | POA: Diagnosis not present

## 2022-12-20 DIAGNOSIS — C678 Malignant neoplasm of overlapping sites of bladder: Secondary | ICD-10-CM | POA: Diagnosis not present

## 2022-12-27 DIAGNOSIS — C678 Malignant neoplasm of overlapping sites of bladder: Secondary | ICD-10-CM | POA: Diagnosis not present

## 2022-12-27 DIAGNOSIS — Z5111 Encounter for antineoplastic chemotherapy: Secondary | ICD-10-CM | POA: Diagnosis not present

## 2023-01-03 DIAGNOSIS — I10 Essential (primary) hypertension: Secondary | ICD-10-CM | POA: Diagnosis not present

## 2023-01-03 DIAGNOSIS — E782 Mixed hyperlipidemia: Secondary | ICD-10-CM | POA: Diagnosis not present

## 2023-01-03 DIAGNOSIS — R7301 Impaired fasting glucose: Secondary | ICD-10-CM | POA: Diagnosis not present

## 2023-01-03 DIAGNOSIS — Z0000 Encounter for general adult medical examination without abnormal findings: Secondary | ICD-10-CM | POA: Diagnosis not present

## 2023-01-03 DIAGNOSIS — R82998 Other abnormal findings in urine: Secondary | ICD-10-CM | POA: Diagnosis not present

## 2023-01-03 DIAGNOSIS — E559 Vitamin D deficiency, unspecified: Secondary | ICD-10-CM | POA: Diagnosis not present

## 2023-01-29 DIAGNOSIS — Z8551 Personal history of malignant neoplasm of bladder: Secondary | ICD-10-CM | POA: Diagnosis not present

## 2023-01-29 DIAGNOSIS — R8289 Other abnormal findings on cytological and histological examination of urine: Secondary | ICD-10-CM | POA: Diagnosis not present

## 2023-03-23 DIAGNOSIS — M1612 Unilateral primary osteoarthritis, left hip: Secondary | ICD-10-CM | POA: Diagnosis not present

## 2023-04-12 DIAGNOSIS — M1612 Unilateral primary osteoarthritis, left hip: Secondary | ICD-10-CM | POA: Diagnosis not present

## 2023-05-27 ENCOUNTER — Other Ambulatory Visit: Payer: Self-pay | Admitting: Cardiology

## 2023-05-28 ENCOUNTER — Other Ambulatory Visit: Payer: Self-pay | Admitting: Cardiology

## 2023-05-30 DIAGNOSIS — Z8546 Personal history of malignant neoplasm of prostate: Secondary | ICD-10-CM | POA: Diagnosis not present

## 2023-05-30 DIAGNOSIS — Z8551 Personal history of malignant neoplasm of bladder: Secondary | ICD-10-CM | POA: Diagnosis not present

## 2023-06-20 ENCOUNTER — Telehealth: Payer: Self-pay | Admitting: *Deleted

## 2023-06-20 DIAGNOSIS — Z5111 Encounter for antineoplastic chemotherapy: Secondary | ICD-10-CM | POA: Diagnosis not present

## 2023-06-20 DIAGNOSIS — C678 Malignant neoplasm of overlapping sites of bladder: Secondary | ICD-10-CM | POA: Diagnosis not present

## 2023-06-20 NOTE — Telephone Encounter (Signed)
   Name: Austin Moses  DOB: 07-24-1956  MRN: [id number]  Primary Cardiologist: Donato Schultz, MD Last OV: 04/13/22 Procedure/date: Left total hip arthroplasty on 07/17/2023  Chart reviewed as part of pre-operative protocol coverage. Because of Austin Moses's past medical history and time since last visit, he will require a follow-up in-office visit in order to better assess preoperative cardiovascular risk.  Pre-op covering staff: - Please schedule appointment and call patient to inform them. If patient already had an upcoming appointment within acceptable timeframe, please add "pre-op clearance" to the appointment notes so provider is aware. - Please contact requesting surgeon's office via preferred method (i.e, phone, fax) to inform them of need for appointment prior to surgery.  Per office protocol, if patient is without any new symptoms or concerns at the time of their visit, he may hold aspirin for 7 days prior to procedure. Please resume aspirin 81 mg daily as soon as possible postprocedure, at the discretion of the surgeon.    Rip Harbour, NP  06/20/2023, 4:07 PM

## 2023-06-20 NOTE — Telephone Encounter (Signed)
Pt has been scheduled in office pre op appt with Joni Reining, DNP. Pt aware the appt is at the [facility] office 07/02/23 @ 1:55. I will update all parties involved.

## 2023-06-20 NOTE — Telephone Encounter (Signed)
   Pre-operative Risk Assessment    Patient Name: Austin Moses  DOB: [birth year] MRN: [id number]  DATE OF LAST VISIT: 04/13/22 DR. SKAINS DATE OF NEXT VISIT: NONE    Request for Surgical Clearance    Procedure:   LEFT TOTAL HIP ARTHROPLASTY  Date of Surgery:  Clearance 07/17/23                                 Surgeon:  DR. Ollen Gross Surgeon's Group or Practice Name:  [facility] Phone number:  [phone] Aida Raider Fax number:  [phone]   Type of Clearance Requested:   - Medical  - Pharmacy:  Hold Aspirin     Type of Anesthesia:   CHOICE   Additional requests/questions:    [name]   06/20/2023, 2:53 PM

## 2023-06-25 ENCOUNTER — Other Ambulatory Visit: Payer: Self-pay | Admitting: Cardiology

## 2023-07-01 NOTE — Progress Notes (Unsigned)
Cardiology Office Note:  .   Date:  07/02/2023  ID:  CHINO FELTZ, DOB October 30, 1955, MRN [id number] PCP: Adrian Prince, MD  [facility] Providers Cardiologist:  Donato Schultz, MD }   History of Present Illness: Austin Moses is a 67 y.o. male with known history of coronary artery disease: Mild to moderate diffuse three-vessel atherosclerosis nonobstructive per cath on 11/10/2019, widely patent left main with 30% mid LAD,  HL. Last seen by Dr. Anne Fu 04/14/2023. He is here for pre-op evaluation for left total hip arthroplasty on 07/17/2023 with Dr. Trudee Grip with recommendations to hold ASA.   He comes today without any cardiac complaints.  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  He plays golf.  Through all of this he just denies any chest pain, excessive shortness of breath, fatigue, dizziness.  He is medically compliant.  ROS: As above otherwise negative  Studies Reviewed: Marland Kitchen   Cath 11/10/19: Mild to moderate diffuse three-vessel atherosclerosis/luminal irregularity and calcification.  No obstructive disease is noted. Widely patent left main 30% mid LAD.  LAD wraps around the left ventricular apex. Codominant circumflex with no significant obstruction Codominant right coronary with no significant obstruction. Overall normal LV function.  EF 50 to 60%.  LVEDP is normal.   RECOMMENDATIONS:   Aggressive risk factor modification. Diagnostic Dominance: Co-dominant   EKG Interpretation Date/Time:  Monday July 02 2023 13:57:03 EST Ventricular Rate:  65 PR Interval:  164 QRS Duration:  92 QT Interval:  416 QTC Calculation: 432 R Axis:   17  Text Interpretation: Normal sinus rhythm Normal ECG When compared with ECG of 28-Nov-2021 08:23, No significant change was found Confirmed by Joni Reining  [phone]) on 07/02/2023 2:14:24 PM    Physical Exam:   VS:  BP (!) 142/90 (BP Location: Left Arm, Patient Position: Sitting, Cuff Size: Large)   Pulse 65   Ht 6\' 1"  (1.854 m)   Wt 267 lb 9.6 oz (121.4 kg)   SpO2 97%   BMI 35.31 kg/m     Wt Readings from Last 3 Encounters:  07/02/23 267 lb 9.6 oz (121.4 kg)  04/13/22 284 lb 8 oz (129 kg)  12/27/21 272 lb 14.9 oz (123.8 kg)    GEN: Well nourished, well developed in no acute distress NECK: No JVD; No carotid bruits CARDIAC: RRR, no murmurs, rubs, gallops RESPIRATORY:  Clear to auscultation without rales, wheezing or rhonchi  ABDOMEN: Soft, non-tender, non-distended EXTREMITIES:  No edema; No deformity   ASSESSMENT AND PLAN: .    Pre-Op Clearance:  According to the Revised Cardiac Risk Index (RCRI), 0.9 His Functional Capacity in METs is: 7.96 according to the Duke Activity Status Index (DASI).   Therefore, based on ACC/AHA guidelines, patient would be at acceptable risk for the planned procedure without further cardiovascular testing. I will route this recommendation to the requesting party via Epic fax function.   2. Hyperlipidemia: Remains on rosuvastatin and Zetia.  Labs are followed by his primary care provider Dr. Evlyn Kanner.  They are completed every 6 months.  3.  Hypertension: Slightly elevated today.  He did eat some salty food over the Thanksgiving weekend.  Will not make any changes currently.  Continue amlodipine 5/10 of benazepril (Lotrel) as directed.  If blood pressure is consistently greater than  150/90 we will need to titrate to higher dose.          Signed, Bettey Mare. Liborio Nixon, ANP, AACC

## 2023-07-02 ENCOUNTER — Ambulatory Visit: Payer: Medicare Other | Attending: Adult Health | Admitting: Adult Health

## 2023-07-02 ENCOUNTER — Encounter: Payer: Self-pay | Admitting: Adult Health

## 2023-07-02 VITALS — BP 142/90 | HR 65 | Ht 73.0 in | Wt 267.6 lb

## 2023-07-02 DIAGNOSIS — I10 Essential (primary) hypertension: Secondary | ICD-10-CM | POA: Diagnosis not present

## 2023-07-02 DIAGNOSIS — Z01818 Encounter for other preprocedural examination: Secondary | ICD-10-CM

## 2023-07-02 DIAGNOSIS — E7800 Pure hypercholesterolemia, unspecified: Secondary | ICD-10-CM | POA: Diagnosis not present

## 2023-07-02 DIAGNOSIS — C678 Malignant neoplasm of overlapping sites of bladder: Secondary | ICD-10-CM | POA: Diagnosis not present

## 2023-07-02 DIAGNOSIS — Z5111 Encounter for antineoplastic chemotherapy: Secondary | ICD-10-CM | POA: Diagnosis not present

## 2023-07-02 NOTE — Patient Instructions (Signed)
Medication Instructions:  No Changes *If you need a refill on your cardiac medications before your next appointment, please call your pharmacy*   Lab Work: No Labs If you have labs (blood work) drawn today and your tests are completely normal, you will receive your results only by: [place] (if you have MyChart) OR A paper copy in the mail If you have any lab test that is abnormal or we need to change your treatment, we will call you to review the results.   Testing/Procedures: No Testing   Follow-Up: At [facility], you and your health needs are our priority.  As part of our continuing mission to provide you with exceptional heart care, we have created designated Provider Care Teams.  These Care Teams include your primary Cardiologist (physician) and Advanced Practice Providers (APPs -  Physician Assistants and Nurse Practitioners) who all work together to provide you with the care you need, when you need it.  We recommend signing up for the patient portal called "MyChart".  Sign up information is provided on this After Visit Summary.  MyChart is used to connect with patients for Virtual Visits (Telemedicine).  Patients are able to view lab/test results, encounter notes, upcoming appointments, etc.  Non-urgent messages can be sent to your provider as well.   To learn more about what you can do with MyChart, go to [email or web address].    Your next appointment:   1 year(s)  Provider:   Candee Furbish, MD

## 2023-07-09 DIAGNOSIS — C678 Malignant neoplasm of overlapping sites of bladder: Secondary | ICD-10-CM | POA: Diagnosis not present

## 2023-07-17 DIAGNOSIS — M1612 Unilateral primary osteoarthritis, left hip: Secondary | ICD-10-CM | POA: Diagnosis not present

## 2023-08-22 DIAGNOSIS — R8289 Other abnormal findings on cytological and histological examination of urine: Secondary | ICD-10-CM | POA: Diagnosis not present

## 2023-08-22 DIAGNOSIS — Z8551 Personal history of malignant neoplasm of bladder: Secondary | ICD-10-CM | POA: Diagnosis not present

## 2023-08-22 DIAGNOSIS — Z8546 Personal history of malignant neoplasm of prostate: Secondary | ICD-10-CM | POA: Diagnosis not present

## 2023-08-29 DIAGNOSIS — Z471 Aftercare following joint replacement surgery: Secondary | ICD-10-CM | POA: Diagnosis not present

## 2023-10-29 DIAGNOSIS — D414 Neoplasm of uncertain behavior of bladder: Secondary | ICD-10-CM | POA: Diagnosis not present

## 2023-11-28 DIAGNOSIS — Z8551 Personal history of malignant neoplasm of bladder: Secondary | ICD-10-CM | POA: Diagnosis not present

## 2023-11-28 DIAGNOSIS — Z8546 Personal history of malignant neoplasm of prostate: Secondary | ICD-10-CM | POA: Diagnosis not present

## 2023-11-28 DIAGNOSIS — R8289 Other abnormal findings on cytological and histological examination of urine: Secondary | ICD-10-CM | POA: Diagnosis not present

## 2023-12-12 DIAGNOSIS — C678 Malignant neoplasm of overlapping sites of bladder: Secondary | ICD-10-CM | POA: Diagnosis not present

## 2023-12-19 DIAGNOSIS — K080 Exfoliation of teeth due to systemic causes: Secondary | ICD-10-CM | POA: Diagnosis not present

## 2023-12-19 DIAGNOSIS — C678 Malignant neoplasm of overlapping sites of bladder: Secondary | ICD-10-CM | POA: Diagnosis not present

## 2023-12-19 DIAGNOSIS — Z5111 Encounter for antineoplastic chemotherapy: Secondary | ICD-10-CM | POA: Diagnosis not present

## 2023-12-26 DIAGNOSIS — C678 Malignant neoplasm of overlapping sites of bladder: Secondary | ICD-10-CM | POA: Diagnosis not present

## 2024-01-14 DIAGNOSIS — C672 Malignant neoplasm of lateral wall of bladder: Secondary | ICD-10-CM | POA: Diagnosis not present

## 2024-01-14 DIAGNOSIS — E782 Mixed hyperlipidemia: Secondary | ICD-10-CM | POA: Diagnosis not present

## 2024-01-14 DIAGNOSIS — E559 Vitamin D deficiency, unspecified: Secondary | ICD-10-CM | POA: Diagnosis not present

## 2024-01-14 DIAGNOSIS — I10 Essential (primary) hypertension: Secondary | ICD-10-CM | POA: Diagnosis not present

## 2024-01-14 DIAGNOSIS — R82998 Other abnormal findings in urine: Secondary | ICD-10-CM | POA: Diagnosis not present

## 2024-01-14 DIAGNOSIS — Z8546 Personal history of malignant neoplasm of prostate: Secondary | ICD-10-CM | POA: Diagnosis not present

## 2024-01-14 DIAGNOSIS — R7301 Impaired fasting glucose: Secondary | ICD-10-CM | POA: Diagnosis not present

## 2024-01-14 DIAGNOSIS — Z0000 Encounter for general adult medical examination without abnormal findings: Secondary | ICD-10-CM | POA: Diagnosis not present

## 2024-02-08 ENCOUNTER — Other Ambulatory Visit: Payer: Self-pay | Admitting: Cardiology

## 2024-03-26 DIAGNOSIS — Z8551 Personal history of malignant neoplasm of bladder: Secondary | ICD-10-CM | POA: Diagnosis not present

## 2024-03-26 DIAGNOSIS — R8289 Other abnormal findings on cytological and histological examination of urine: Secondary | ICD-10-CM | POA: Diagnosis not present

## 2024-06-30 DIAGNOSIS — Z8551 Personal history of malignant neoplasm of bladder: Secondary | ICD-10-CM | POA: Diagnosis not present

## 2024-06-30 DIAGNOSIS — N393 Stress incontinence (female) (male): Secondary | ICD-10-CM | POA: Diagnosis not present

## 2024-07-01 DIAGNOSIS — K080 Exfoliation of teeth due to systemic causes: Secondary | ICD-10-CM | POA: Diagnosis not present

## 2024-07-03 ENCOUNTER — Other Ambulatory Visit: Payer: Self-pay | Admitting: Urology

## 2024-07-29 NOTE — Progress Notes (Signed)
 Anesthesia Review:  PCP: Garnette Ore  Cardiologist : Lamarr Satterfield LOV 07/02/2023   PPM/ ICD: Device Orders: Rep Notified:  Chest x-ray : EKG : Echo : 2021  Stress test: Cardiac Cath :  2021  Ct Card-2021   Activity level:  Sleep Study/ CPAP : Fasting Blood Sugar :      / Checks Blood Sugar -- times a day:    Blood Thinner/ Instructions /Last Dose: ASA / Instructions/ Last Dose :    PT had runny nose, slight cough and fever of 99 on 07/27/24. PT presented to preop on 07/30/24 with slight runny nose and slight cough.  PT states wife is not sick   PT states he feels fine.  PT was discharged from preop dept and med hx and preop instructions done via phone call . Labs to be done dos.  Charge nurse aware. PT instructed at end of phone call to call [facility] on 07/30/24 prior to his appt on 07/30/24 with symptoms and let them be aware before he goes to [facility] appt this pm.  PT voiced understanding.

## 2024-07-29 NOTE — Patient Instructions (Signed)
 SURGICAL WAITING ROOM VISITATION  Patients having surgery or a procedure may have no more than 2 support people in the waiting area - these visitors may rotate.    Children ages 108 and under will not be able to visit patients in [facility] under most circumstances.   Visitors with respiratory illnesses are discouraged from visiting and should remain at home.  If the patient needs to stay at the hospital during part of their recovery, the visitor guidelines for inpatient rooms apply. Pre-op nurse will coordinate an appropriate time for 1 support person to accompany patient in pre-op.  This support person may not rotate.    Please refer to the [facility] website for the visitor guidelines for Inpatients (after your surgery is over and you are in a regular room).       Your procedure is scheduled on:  08/09/2023    Report to [facility] Main Entrance    Report to admitting at   [phone]   Call this number if you have problems the morning of surgery [phone]   Do not eat food  or drink liquids :After Midnight.                             If you have questions, please contact your surgeons office.       Oral Hygiene is also important to reduce your risk of infection.                                    Remember - BRUSH YOUR TEETH THE MORNING OF SURGERY WITH YOUR REGULAR TOOTHPASTE  DENTURES WILL BE REMOVED PRIOR TO SURGERY PLEASE DO NOT APPLY Poly grip OR ADHESIVES!!!   Do NOT smoke after Midnight   Stop all vitamins and herbal supplements 7 days before surgery.   Take these medicines the morning of surgery with A SIP OF WATER :    DO NOT TAKE ANY ORAL DIABETIC MEDICATIONS DAY OF YOUR SURGERY  Bring CPAP mask and tubing day of surgery.                              You may not have any metal on your body including hair pins, jewelry, and body piercing             Do not wear make-up, lotions, powders, perfumes/cologne, or deodorant  Do not wear  nail polish including gel and S&S, artificial/acrylic nails, or any other type of covering on natural nails including finger and toenails. If you have artificial nails, gel coating, etc. that needs to be removed by a nail salon please have this removed prior to surgery or surgery may need to be canceled/ delayed if the surgeon/ anesthesia feels like they are unable to be safely monitored.   Do not shave  48 hours prior to surgery.               Men may shave face and neck.   Do not bring valuables to the hospital. [facility] IS NOT             RESPONSIBLE   FOR VALUABLES.   Contacts, glasses, dentures or bridgework may not be worn into surgery.   Bring small overnight bag day of surgery.   DO NOT BRING YOUR HOME MEDICATIONS TO THE  HOSPITAL. PHARMACY WILL DISPENSE MEDICATIONS LISTED ON YOUR MEDICATION LIST TO YOU DURING YOUR ADMISSION IN THE HOSPITAL!    Patients discharged on the day of surgery will not be allowed to drive home.  Someone NEEDS to stay with you for the first 24 hours after anesthesia.   Special Instructions: Bring a copy of your healthcare power of attorney and living will documents the day of surgery if you haven't scanned them before.              Please read over the following fact sheets you were given: IF YOU HAVE QUESTIONS ABOUT YOUR PRE-OP INSTRUCTIONS PLEASE CALL [phone].   If you received a COVID test during your pre-op visit  it is requested that you wear a mask when out in public, stay away from anyone that may not be feeling well and notify your surgeon if you develop symptoms. If you test positive for Covid or have been in contact with anyone that has tested positive in the last 10 days please notify you surgeon.    [place] - Preparing for Surgery Before surgery, you can play an important role.  Because skin is not sterile, your skin needs to be as free of germs as possible.  You can reduce the number of germs on your skin by washing with CHG  (chlorahexidine gluconate) soap before surgery.  CHG is an antiseptic cleaner which kills germs and bonds with the skin to continue killing germs even after washing. Please DO NOT use if you have an allergy to CHG or antibacterial soaps.  If your skin becomes reddened/irritated stop using the CHG and inform your nurse when you arrive at Short Stay. Do not shave (including legs and underarms) for at least 48 hours prior to the first CHG shower.  You may shave your face/neck. Please follow these instructions carefully:  1.  Shower with CHG Soap the night before surgery and the  morning of Surgery.  2.  If you choose to wash your hair, wash your hair first as usual with your  normal  shampoo.  3.  After you shampoo, rinse your hair and body thoroughly to remove the  shampoo.                           4.  Use CHG as you would any other liquid soap.  You can apply chg directly  to the skin and wash                       Gently with a scrungie or clean washcloth.  5.  Apply the CHG Soap to your body ONLY FROM THE NECK DOWN.   Do not use on face/ open                           Wound or open sores. Avoid contact with eyes, ears mouth and genitals (private parts).                       Wash face,  Genitals (private parts) with your normal soap.             6.  Wash thoroughly, paying special attention to the area where your surgery  will be performed.  7.  Thoroughly rinse your body with warm water  from the neck down.  8.  DO NOT shower/wash with your normal soap  after using and rinsing off  the CHG Soap.                9.  Pat yourself dry with a clean towel.            10.  Wear clean pajamas.            11.  Place clean sheets on your bed the night of your first shower and do not  sleep with pets. Day of Surgery : Do not apply any lotions/deodorants the morning of surgery.  Please wear clean clothes to the hospital/surgery center.  FAILURE TO FOLLOW THESE INSTRUCTIONS MAY RESULT IN THE CANCELLATION OF  YOUR SURGERY PATIENT SIGNATURE_________________________________  NURSE SIGNATURE__________________________________  ________________________________________________________________________

## 2024-07-30 ENCOUNTER — Encounter (HOSPITAL_COMMUNITY): Payer: Self-pay

## 2024-07-30 ENCOUNTER — Encounter (HOSPITAL_COMMUNITY)
Admission: RE | Admit: 2024-07-30 | Discharge: 2024-07-30 | Disposition: A | Discharge status: Home / Self Care | Source: Ambulatory Visit | Attending: Urology | Admitting: Urology

## 2024-07-30 ENCOUNTER — Other Ambulatory Visit: Payer: Self-pay

## 2024-07-30 DIAGNOSIS — Z01818 Encounter for other preprocedural examination: Secondary | ICD-10-CM

## 2024-07-30 HISTORY — DX: Atherosclerotic heart disease of native coronary artery without angina pectoris: I25.10

## 2024-08-01 ENCOUNTER — Other Ambulatory Visit (HOSPITAL_COMMUNITY): Payer: Self-pay | Admitting: Adult Health

## 2024-08-01 ENCOUNTER — Ambulatory Visit (HOSPITAL_COMMUNITY)
Admission: RE | Admit: 2024-08-01 | Discharge: 2024-08-01 | Disposition: A | Discharge status: Home / Self Care | Source: Ambulatory Visit | Attending: Adult Health | Admitting: Adult Health

## 2024-08-01 ENCOUNTER — Encounter (HOSPITAL_COMMUNITY): Payer: Self-pay

## 2024-08-01 DIAGNOSIS — C674 Malignant neoplasm of posterior wall of bladder: Secondary | ICD-10-CM | POA: Insufficient documentation

## 2024-08-01 NOTE — Anesthesia Preprocedure Evaluation (Addendum)
"                                    Anesthesia Evaluation  Patient identified by MRN, date of birth, ID band Patient awake    Reviewed: Allergy & Precautions, NPO status , Patient's Chart, lab work & pertinent test results  History of Anesthesia Complications Negative for: history of anesthetic complications  Airway Mallampati: II  TM Distance: >3 FB Neck ROM: Full    Dental no notable dental hx. (+) Teeth Intact, Dental Advisory Given   Pulmonary neg pulmonary ROS   Pulmonary exam normal breath sounds clear to auscultation       Cardiovascular hypertension, Pt. on medications (-) angina + CAD  (-) Past MI Normal cardiovascular exam+ Valvular Problems/Murmurs MR  Rhythm:Regular Rate:Normal  2021 TTE  NL EV   Neuro/Psych negative neurological ROS  negative psych ROS   GI/Hepatic ,neg GERD  ,,  Endo/Other  neg diabetes    Renal/GU     prostate cancer s/p prostatectomy     Musculoskeletal   Abdominal   Peds  Hematology   Anesthesia Other Findings All: Sulfa  Reproductive/Obstetrics                              Anesthesia Physical Anesthesia Plan  ASA: 2  Anesthesia Plan: General   Post-op Pain Management: Ofirmev  IV (intra-op)*   Induction: Intravenous  PONV Risk Score and Plan: 3 and Treatment may vary due to age or medical condition, Ondansetron , Dexamethasone  and Midazolam   Airway Management Planned: LMA  Additional Equipment: None  Intra-op Plan:   Post-operative Plan: Extubation in OR  Informed Consent: I have reviewed the patients History and Physical, chart, labs and discussed the procedure including the risks, benefits and alternatives for the proposed anesthesia with the patient or authorized representative who has indicated his/her understanding and acceptance.     Dental advisory given  Plan Discussed with: CRNA and Surgeon  Anesthesia Plan Comments: (See PAT note from 12/31)          Anesthesia Quick Evaluation  "

## 2024-08-01 NOTE — Progress Notes (Signed)
 " Case: [id number] Date/Time: 08/08/24 0715   Procedures:      CYSTOSCOPY, WITH BLADDER FULGURATION     TURBT (TRANSURETHRAL RESECTION OF BLADDER TUMOR)     CYSTOSCOPY, WITH RETROGRADE PYELOGRAM (Bilateral)   Anesthesia type: General   Diagnosis: Personal history of malignant neoplasm of bladder [Z85.51]   Pre-op diagnosis: HISTORY OF BLADDER CANCER   Location: [facility] PROC[facility]   Surgeons: Watt Rush, MD       DISCUSSION: Austin Moses is a 69 yo male with PMH of HTN, nonobstructive CAD by cath in 2021, mild MR, prostate cancer s/p prostatectomy (2018), bladder cancer, arthritis.  Patient follows with cardiology for history of nonobstructive CAD.  Last seen in clinic on 07/02/2023 for preop clearance prior to hip surgery (likely done at a surgery center). He was cleared for surgery at that time.  Advised to continue medical therapy and follow-up in 1 year.  Patient reported URI symptoms at preop visit on 12/31 with runny nose and cough. Pt reports otherwise feeling well  Due to non elective surgery recommend eval DOS.  Pre op done over the phone. Needs Labs, EKG on DOS  VS:  Wt Readings from Last 3 Encounters:  07/02/23 121.4 kg  04/13/22 129 kg  12/27/21 123.8 kg   Temp Readings from Last 3 Encounters:  12/27/21 36.5 C  11/30/21 36.5 C (Oral)  11/28/21 36.6 C (Oral)   BP Readings from Last 3 Encounters:  07/02/23 (!) 142/90  04/13/22 138/88  12/27/21 135/85   Pulse Readings from Last 3 Encounters:  07/02/23 65  04/13/22 65  12/27/21 63     PROVIDERS: Nichole Senior, MD   LABS: Obtain DOS    EKG Obtain DOS   Echo 11/28/2019:  IMPRESSIONS    1. LV global longitudinal strain is -20.7%. Left ventricular ejection fraction, by estimation, is 60 to 65%. The left ventricle has normal function. The left ventricle has no regional wall motion abnormalities. The left ventricular internal cavity size was mildly dilated. Left ventricular diastolic  parameters were normal.  2. Right ventricular systolic function is normal. The right ventricular size is normal. There is normal pulmonary artery systolic pressure.  3. The mitral valve is normal in structure. Mild mitral valve regurgitation.  4. The aortic valve is abnormal. Aortic valve regurgitation is not visualized. Mild aortic valve sclerosis is present, with no evidence of aortic valve stenosis.  LHC 11/10/2019:  Mild to moderate diffuse three-vessel atherosclerosis/luminal irregularity and calcification.  No obstructive disease is noted. Widely patent left main 30% mid LAD.  LAD wraps around the left ventricular apex. Codominant circumflex with no significant obstruction Codominant right coronary with no significant obstruction. Overall normal LV function.  EF 50 to 60%.  LVEDP is normal.   RECOMMENDATIONS:   Aggressive risk factor modification.   Past Medical History:  Diagnosis Date   CAD (coronary artery disease)    Cancer (HCC)    prostate cancer   Cancer of prostate (HCC)    Cataract    Hyperlipidemia    Hypertension    Impaired fasting blood sugar    Joint pain    Low testosterone  in male    Obesity    Protrusion of lumbar intervertebral disc    L3-L4, L5-S1   Vitamin D deficiency     Past Surgical History:  Procedure Laterality Date   APPENDECTOMY     COLONOSCOPY  12/14/2010   Normal   cotisone injection  05/31/2016   every 3-4  months in left knee   EYE SURGERY     bilateral cataract surgery with lens implants   KNEE ARTHROPLASTY Left    KNEE ARTHROSCOPY     dr supple- left knee   LEFT HEART CATH AND CORONARY ANGIOGRAPHY N/A 11/10/2019   Procedure: LEFT HEART CATH AND CORONARY ANGIOGRAPHY;  Surgeon: Claudene Victory ORN, MD;  Location: [facility] INVASIVE [facility];  Service: Cardiovascular;  Laterality: N/A;   PROSTATE BIOPSY     ROBOT ASSISTED LAPAROSCOPIC RADICAL PROSTATECTOMY N/A 08/07/2016   Procedure: ROBOTIC ASSISTED LAPAROSCOPIC RADICAL PROSTATECTOMY LEVEL  1;  Surgeon: Gretel Ferrara, MD;  Location: [facility];  Service: Urology;  Laterality: N/A;   TRANSURETHRAL RESECTION OF BLADDER TUMOR N/A 12/27/2021   Procedure: TRANSURETHRAL RESECTION OF BLADDER TUMOR (TURBT);  Surgeon: Watt Rush, MD;  Location: [facility];  Service: Urology;  Laterality: N/A;  1 HR FOR CASE   TRANSURETHRAL RESECTION OF BLADDER TUMOR WITH MITOMYCIN -C Bilateral 11/29/2021   Procedure: CYSTOSCOPY BILATERAL RETROGRADES TRANSURETHRAL RESECTION OF BLADDER TUMOR;  Surgeon: Watt Rush, MD;  Location: [facility];  Service: Urology;  Laterality: Bilateral;    MEDICATIONS:  amLODipine -benazepril  (LOTREL) 5-10 MG capsule   ezetimibe  (ZETIA ) 10 MG tablet   ibuprofen (ADVIL) 200 MG tablet   Multiple Vitamin (MULTIVITAMIN WITH MINERALS) TABS tablet   rosuvastatin  (CRESTOR ) 40 MG tablet   Vitamin D, Ergocalciferol, (DRISDOL) 1.25 MG (50000 UNIT) CAPS capsule   No current facility-administered medications for this encounter.   Burnard CHRISTELLA Odis DEVONNA [facility] Short Stay/Anesthesiology [facility] Phone [phone] 08/01/2024 2:09 PM        "

## 2024-08-08 ENCOUNTER — Ambulatory Visit (HOSPITAL_COMMUNITY): Payer: Self-pay | Admitting: Medical

## 2024-08-08 ENCOUNTER — Ambulatory Visit (HOSPITAL_COMMUNITY): Admitting: Anesthesiology

## 2024-08-08 ENCOUNTER — Encounter (HOSPITAL_COMMUNITY): Payer: Self-pay | Admitting: Urology

## 2024-08-08 ENCOUNTER — Other Ambulatory Visit: Payer: Self-pay

## 2024-08-08 ENCOUNTER — Ambulatory Visit (HOSPITAL_COMMUNITY)
Admission: RE | Admit: 2024-08-08 | Discharge: 2024-08-08 | Disposition: A | Discharge status: Home / Self Care | Source: Ambulatory Visit | Attending: Urology | Admitting: Urology

## 2024-08-08 ENCOUNTER — Ambulatory Visit (HOSPITAL_COMMUNITY)

## 2024-08-08 ENCOUNTER — Encounter (HOSPITAL_COMMUNITY)
Admission: RE | Disposition: A | Discharge status: Home / Self Care | Payer: Self-pay | Source: Ambulatory Visit | Attending: Urology

## 2024-08-08 DIAGNOSIS — I10 Essential (primary) hypertension: Secondary | ICD-10-CM | POA: Insufficient documentation

## 2024-08-08 DIAGNOSIS — Z8551 Personal history of malignant neoplasm of bladder: Secondary | ICD-10-CM | POA: Insufficient documentation

## 2024-08-08 DIAGNOSIS — Z01818 Encounter for other preprocedural examination: Secondary | ICD-10-CM

## 2024-08-08 DIAGNOSIS — N329 Bladder disorder, unspecified: Secondary | ICD-10-CM | POA: Insufficient documentation

## 2024-08-08 DIAGNOSIS — Z8546 Personal history of malignant neoplasm of prostate: Secondary | ICD-10-CM | POA: Insufficient documentation

## 2024-08-08 DIAGNOSIS — Z9079 Acquired absence of other genital organ(s): Secondary | ICD-10-CM | POA: Diagnosis not present

## 2024-08-08 HISTORY — PX: TRANSURETHRAL RESECTION OF BLADDER TUMOR: SHX2575

## 2024-08-08 HISTORY — PX: CYSTOSCOPY W/ RETROGRADES: SHX1426

## 2024-08-08 HISTORY — PX: CYSTOSCOPY WITH FULGERATION: SHX6638

## 2024-08-08 SURGERY — CYSTOSCOPY, WITH BLADDER FULGURATION
Anesthesia: General

## 2024-08-08 MED ORDER — PROPOFOL 10 MG/ML IV BOLUS
INTRAVENOUS | Status: DC | PRN
  Administered 2024-08-08: 200 mg via INTRAVENOUS

## 2024-08-08 MED ORDER — LIDOCAINE HCL (PF) 2 % IJ SOLN
INTRAMUSCULAR | Status: DC | PRN
  Administered 2024-08-08: 100 mg via INTRADERMAL

## 2024-08-08 MED ORDER — SODIUM CHLORIDE 0.9 % IR SOLN
Status: DC | PRN
  Administered 2024-08-08: 6000 mL

## 2024-08-08 MED ORDER — FENTANYL CITRATE (PF) 100 MCG/2ML IJ SOLN
INTRAMUSCULAR | Status: DC | PRN
  Administered 2024-08-08: 100 ug via INTRAVENOUS

## 2024-08-08 MED ORDER — ONDANSETRON HCL 4 MG/2ML IJ SOLN
INTRAMUSCULAR | Status: DC | PRN
  Administered 2024-08-08: 4 mg via INTRAVENOUS

## 2024-08-08 MED ORDER — LACTATED RINGERS IV SOLN: INTRAVENOUS | Status: DC

## 2024-08-08 MED ORDER — HYDROMORPHONE HCL 1 MG/ML IJ SOLN: 0.2500 mg | INTRAMUSCULAR | Status: DC | PRN

## 2024-08-08 MED ORDER — SODIUM CHLORIDE 0.9% FLUSH: 3.0000 mL | Freq: Two times a day (BID) | INTRAVENOUS | Status: DC

## 2024-08-08 MED ORDER — IOHEXOL 300 MG/ML  SOLN
INTRAMUSCULAR | Status: DC | PRN
  Administered 2024-08-08: 15 mL

## 2024-08-08 MED ORDER — OXYCODONE HCL 5 MG/5ML PO SOLN: 5.0000 mg | Freq: Once | ORAL | Status: DC | PRN

## 2024-08-08 MED ORDER — CEFAZOLIN SODIUM-DEXTROSE 2-4 GM/100ML-% IV SOLN
2.0000 g | INTRAVENOUS | Status: AC
  Administered 2024-08-08: 2 g via INTRAVENOUS
  Filled 2024-08-08: qty 100

## 2024-08-08 MED ORDER — ORAL CARE MOUTH RINSE: 15.0000 mL | Freq: Once | OROMUCOSAL | Status: AC

## 2024-08-08 MED ORDER — OXYCODONE HCL 5 MG PO TABS: 5.0000 mg | ORAL_TABLET | Freq: Once | ORAL | Status: DC | PRN

## 2024-08-08 MED ORDER — DEXAMETHASONE SOD PHOSPHATE PF 10 MG/ML IJ SOLN
INTRAMUSCULAR | Status: DC | PRN
  Administered 2024-08-08: 10 mg via INTRAVENOUS

## 2024-08-08 MED ORDER — ACETAMINOPHEN 10 MG/ML IV SOLN: 1000.0000 mg | Freq: Once | INTRAVENOUS | Status: DC | PRN

## 2024-08-08 MED ORDER — CHLORHEXIDINE GLUCONATE 0.12 % MT SOLN: 15.0000 mL | Freq: Once | OROMUCOSAL | Status: AC

## 2024-08-08 MED ORDER — MIDAZOLAM HCL 2 MG/2ML IJ SOLN
INTRAMUSCULAR | Status: AC
  Filled 2024-08-08: qty 2

## 2024-08-08 MED ORDER — CHLORHEXIDINE GLUCONATE 0.12 % MT SOLN
15.0000 mL | Freq: Once | OROMUCOSAL | Status: AC
  Administered 2024-08-08: 15 mL via OROMUCOSAL

## 2024-08-08 MED ORDER — ONDANSETRON HCL 4 MG/2ML IJ SOLN: 4.0000 mg | Freq: Once | INTRAMUSCULAR | Status: DC | PRN

## 2024-08-08 MED ORDER — MIDAZOLAM HCL 5 MG/5ML IJ SOLN
INTRAMUSCULAR | Status: DC | PRN
  Administered 2024-08-08: 2 mg via INTRAVENOUS

## 2024-08-08 MED ORDER — FENTANYL CITRATE (PF) 100 MCG/2ML IJ SOLN
INTRAMUSCULAR | Status: AC
  Filled 2024-08-08: qty 2

## 2024-08-08 SURGICAL SUPPLY — 20 items
BAG URINE DRAIN 2000ML AR STRL (UROLOGICAL SUPPLIES) IMPLANT
BAG URO CATCHER STRL LF (MISCELLANEOUS) ×2 IMPLANT
CATH FOLEY 3WAY 30CC 22FR (CATHETERS) IMPLANT
CATH URETL OPEN 5X70 (CATHETERS) IMPLANT
CLOTH BEACON ORANGE TIMEOUT ST (SAFETY) ×2 IMPLANT
DRAPE FOOT SWITCH (DRAPES) ×2 IMPLANT
ELECT REM PT RETURN 15FT ADLT (MISCELLANEOUS) ×2 IMPLANT
GLOVE SURG SS PI 8.0 STRL IVOR (GLOVE) ×2 IMPLANT
GOWN STRL SURGICAL XL XLNG (GOWN DISPOSABLE) ×2 IMPLANT
GUIDEWIRE STR DUAL SENSOR (WIRE) ×2 IMPLANT
HOLDER FOLEY CATH W/STRAP (MISCELLANEOUS) IMPLANT
KIT TURNOVER KIT A (KITS) ×2 IMPLANT
LOOP CUT BIPOLAR 24F LRG (ELECTROSURGICAL) IMPLANT
MANIFOLD NEPTUNE II (INSTRUMENTS) ×2 IMPLANT
PACK CYSTO (CUSTOM PROCEDURE TRAY) ×2 IMPLANT
SUT ETHILON 3 0 PS 1 (SUTURE) IMPLANT
SYR 30ML LL (SYRINGE) IMPLANT
SYRINGE TOOMEY IRRIG 70ML (MISCELLANEOUS) IMPLANT
TUBING CONNECTING 10 (TUBING) ×2 IMPLANT
TUBING UROLOGY SET (TUBING) ×2 IMPLANT

## 2024-08-08 NOTE — Interval H&P Note (Signed)
 History and Physical Interval Note:  08/08/2024 7:06 AM  Austin Moses  has presented today for surgery, with the diagnosis of HISTORY OF BLADDER CANCER.  The various methods of treatment have been discussed with the patient and family. After consideration of risks, benefits and other options for treatment, the patient has consented to  Procedures: CYSTOSCOPY, WITH BLADDER FULGURATION (N/A) TURBT (TRANSURETHRAL RESECTION OF BLADDER TUMOR) (N/A) CYSTOSCOPY, WITH RETROGRADE PYELOGRAM (Bilateral) as a surgical intervention.  The patient's history has been reviewed, patient examined, no change in status, stable for surgery.  I have reviewed the patient's chart and labs.  Questions were answered to the patient's satisfaction.     Nichol Ator

## 2024-08-08 NOTE — Anesthesia Postprocedure Evaluation (Signed)
"   Anesthesia Post Note  Patient: Austin Moses  Procedure(s) Performed: CYSTOSCOPY, WITH BLADDER FULGURATION TURBT (TRANSURETHRAL RESECTION OF BLADDER TUMOR) CYSTOSCOPY, WITH RETROGRADE PYELOGRAM (Bilateral)     Patient location during evaluation: PACU Anesthesia Type: General Level of consciousness: awake and alert Pain management: pain level controlled Vital Signs Assessment: post-procedure vital signs reviewed and stable Respiratory status: spontaneous breathing, nonlabored ventilation, respiratory function stable and patient connected to nasal cannula oxygen Cardiovascular status: blood pressure returned to baseline and stable Postop Assessment: no apparent nausea or vomiting Anesthetic complications: no   No notable events documented.  Last Vitals:  Vitals:   08/08/24 0830 08/08/24 0845  BP: 125/72 121/77  Pulse: 67 66  Resp: 18 17  Temp:  36.7 C  SpO2: 93% 95%    Last Pain:  Vitals:   08/08/24 0845  TempSrc:   PainSc: 0-No pain                 Garnette LABOR Heron Pitcock      "

## 2024-08-08 NOTE — Op Note (Signed)
 Procedure: 1.  Cystoscopy with bilateral retrograde pyelograms and interpretation. 2.  Cystoscopy with biopsy and fulguration of 1.5 cm bladder dome lesion. 3.  Application of fluoroscopy.  Pre-op diagnosis: History of bladder cancer with lesion on the bladder dome.  Postop diagnosis: Same.  Surgeon: Dr. Norleen Seltzer.  Anesthesia: General.  Specimen: Bladder biopsies.  Drains: None.  EBL: None.  Complications: None.  Indications: The patient is a 69 year old male with a history of bladder cancer who has had BCG and who on recent cystoscopy had an erythematous area on the dome of the bladder that was felt to require biopsy to rule out carcinoma in situ.  Procedure: He was taken the operating room where general anesthetic was induced.  He was given 2 g of Ancef .  He was placed in lithotomy position and food PSIs.  His perineum genitalia were prepped with Betadine solution and draped usual sterile fashion.  Cystoscopy was performed using 21 French scope and 30 degree lens.  Examination of the with normal urethra.  The external sphincter was intact.  The prostate was absent.  The bladder wall was smooth and generally pale with the exception of an erythematous area on the dome of the bladder that appeared most consistent with an inflammatory lesion of carcinoma in situ there is a possibility.  There was a resection scar on the right base of the bladder.  No papillary tumors were identified.  Ureteral orifices were unremarkable.  Bilateral retrograde pyelography was performed using a 6 French open-ended catheter and Omnipaque .  The right retrograde pyelogram demonstrated normal ureter and intrarenal collecting system.  The left retrograde pyelogram demonstrated normal ureter and intrarenal collecting system.  He then underwent cup biopsy of the lesion on the dome.  2 specimens were obtained.  The cystoscope was removed and the 26 French continuous-flow resectoscope sheath was inserted.   This is sitting with Faustine handle and a bipolar loop.  The biopsy site and lesion was then generously fulgurated with a small margin around the area of erythema.  Once hemostasis was achieved and inspection of the biopsy site revealed no bladder wall perforation, the bladder was drained and the scope was removed.  Was not felt a Foley catheter was indicated.  He was taken down from lithotomy position, his anesthetic was reversed and he was moved to recovery in stable condition.  There were no complications.

## 2024-08-08 NOTE — Transfer of Care (Signed)
 Immediate Anesthesia Transfer of Care Note  Patient: Austin Moses  Procedure(s) Performed: CYSTOSCOPY, WITH BLADDER FULGURATION TURBT (TRANSURETHRAL RESECTION OF BLADDER TUMOR) CYSTOSCOPY, WITH RETROGRADE PYELOGRAM (Bilateral)  Patient Location: PACU  Anesthesia Type:General  Level of Consciousness: sedated  Airway & Oxygen Therapy: Patient Spontanous Breathing and Patient connected to face mask oxygen  Post-op Assessment: Report given to RN and Post -op Vital signs reviewed and stable  Post vital signs: Reviewed and stable  Last Vitals:  Vitals Value Taken Time  BP 143/101 08/08/24 08:00  Temp    Pulse 79 08/08/24 08:01  Resp 15 08/08/24 08:01  SpO2 100 % 08/08/24 08:01  Vitals shown include unfiled device data.  Last Pain:  Vitals:   08/08/24 0627  TempSrc: Oral  PainSc:          Complications: No notable events documented.

## 2024-08-08 NOTE — Anesthesia Procedure Notes (Signed)
 Procedure Name: LMA Insertion Date/Time: 08/08/2024 7:25 AM  Performed by: Carleton Garnette SAUNDERS, CRNAPre-anesthesia Checklist: Patient identified, Emergency Drugs available, Suction available, Patient being monitored and Timeout performed Patient Re-evaluated:Patient Re-evaluated prior to induction Oxygen Delivery Method: Circle system utilized Preoxygenation: Pre-oxygenation with 100% oxygen Induction Type: IV induction LMA: LMA inserted LMA Size: 5.0 Tube type: Oral Number of attempts: 1 Placement Confirmation: positive ETCO2 and breath sounds checked- equal and bilateral Tube secured with: Tape Dental Injury: Teeth and Oropharynx as per pre-operative assessment

## 2024-08-08 NOTE — Discharge Instructions (Addendum)
 CYSTOSCOPY HOME CARE INSTRUCTIONS  Activity: Rest for the remainder of the day.  Do not drive or operate equipment today.  You may resume normal activities in one to two days as instructed by your physician.   Meals: Drink plenty of liquids and eat light foods such as gelatin or soup this evening.  You may return to a normal meal plan tomorrow.  Return to Work: You may return to work in one to two days or as instructed by your physician.  Special Instructions / Symptoms: Call your physician if any of these symptoms occur:   -persistent or heavy bleeding  -bleeding which continues after first few urination  -large blood clots that are difficult to pass  -urine stream diminishes or stops completely  -fever equal to or higher than 101 degrees Farenheit.  -cloudy urine with a strong, foul odor  -severe pain   Patient Signature:  ________________________________________________________  Nurse's Signature:  ________________________________________________________

## 2024-08-09 ENCOUNTER — Encounter (HOSPITAL_COMMUNITY): Payer: Self-pay | Admitting: Urology

## 2024-08-11 LAB — SURGICAL PATHOLOGY
